# Patient Record
Sex: Female | Born: 1980 | Race: Black or African American | Hispanic: No | Marital: Single | Smoking: Never smoker
Health system: Southern US, Community
[De-identification: ages and names within clinical notes are randomized; demographics above are authoritative.]

## PROBLEM LIST (undated history)

## (undated) ENCOUNTER — Emergency Department (HOSPITAL_COMMUNITY): Payer: Medicaid Other

## (undated) DIAGNOSIS — F419 Anxiety disorder, unspecified: Secondary | ICD-10-CM

## (undated) DIAGNOSIS — J45909 Unspecified asthma, uncomplicated: Secondary | ICD-10-CM

## (undated) DIAGNOSIS — K649 Unspecified hemorrhoids: Secondary | ICD-10-CM

## (undated) HISTORY — PX: TUBAL LIGATION: SHX77

---

## 2004-12-23 HISTORY — PX: TUBAL LIGATION: SHX77

## 2009-05-05 ENCOUNTER — Emergency Department (HOSPITAL_COMMUNITY): Admission: EM | Admit: 2009-05-05 | Discharge: 2009-05-05 | Payer: Self-pay | Admitting: Emergency Medicine

## 2009-05-16 ENCOUNTER — Ambulatory Visit: Payer: Self-pay | Admitting: Gastroenterology

## 2009-06-01 ENCOUNTER — Ambulatory Visit: Payer: Self-pay | Admitting: Gastroenterology

## 2011-04-02 LAB — D-DIMER, QUANTITATIVE: D-Dimer, Quant: 0.49 ug/mL-FEU — ABNORMAL HIGH (ref 0.00–0.48)

## 2011-04-02 LAB — POCT CARDIAC MARKERS
CKMB, poc: <1 ng/mL — ABNORMAL LOW (ref 1.0–8.0)
Myoglobin, poc: 49.2 ng/mL (ref 12–200)
Troponin i, poc: <0.05 ng/mL (ref 0.00–0.09)

## 2011-04-02 LAB — POCT I-STAT, CHEM 8
BUN: 5 mg/dL — ABNORMAL LOW (ref 6–23)
Chloride: 104 mEq/L (ref 96–112)
HCT: 41 % (ref 36.0–46.0)
Potassium: 3.2 mEq/L — ABNORMAL LOW (ref 3.5–5.1)
Sodium: 141 mEq/L (ref 135–145)

## 2011-12-20 ENCOUNTER — Emergency Department (HOSPITAL_COMMUNITY)
Admission: EM | Admit: 2011-12-20 | Discharge: 2011-12-20 | Discharge status: Against Medical Advice | Payer: Self-pay | Attending: Emergency Medicine | Admitting: Emergency Medicine

## 2011-12-20 ENCOUNTER — Encounter: Payer: Self-pay | Admitting: Emergency Medicine

## 2011-12-20 DIAGNOSIS — R52 Pain, unspecified: Secondary | ICD-10-CM | POA: Insufficient documentation

## 2011-12-20 DIAGNOSIS — R51 Headache: Secondary | ICD-10-CM | POA: Insufficient documentation

## 2011-12-20 DIAGNOSIS — R059 Cough, unspecified: Secondary | ICD-10-CM | POA: Insufficient documentation

## 2011-12-20 DIAGNOSIS — R05 Cough: Secondary | ICD-10-CM | POA: Insufficient documentation

## 2011-12-20 NOTE — ED Notes (Signed)
Pt alert, nad, c/o cough, headaches, body aches, onset a few days ago recent ill contact with mother, resp even unlabored, skin bwd, moist npc noted

## 2011-12-22 ENCOUNTER — Emergency Department (HOSPITAL_COMMUNITY)
Admission: EM | Admit: 2011-12-22 | Discharge: 2011-12-22 | Disposition: A | Discharge status: Home / Self Care | Payer: Medicaid Other | Attending: Emergency Medicine | Admitting: Emergency Medicine

## 2011-12-22 ENCOUNTER — Encounter: Payer: Self-pay | Admitting: *Deleted

## 2011-12-22 ENCOUNTER — Emergency Department (HOSPITAL_COMMUNITY): Payer: Medicaid Other

## 2011-12-22 DIAGNOSIS — J189 Pneumonia, unspecified organism: Secondary | ICD-10-CM | POA: Insufficient documentation

## 2011-12-22 DIAGNOSIS — R197 Diarrhea, unspecified: Secondary | ICD-10-CM | POA: Insufficient documentation

## 2011-12-22 DIAGNOSIS — IMO0001 Reserved for inherently not codable concepts without codable children: Secondary | ICD-10-CM | POA: Insufficient documentation

## 2011-12-22 DIAGNOSIS — R509 Fever, unspecified: Secondary | ICD-10-CM | POA: Insufficient documentation

## 2011-12-22 DIAGNOSIS — R1110 Vomiting, unspecified: Secondary | ICD-10-CM | POA: Insufficient documentation

## 2011-12-22 DIAGNOSIS — R05 Cough: Secondary | ICD-10-CM | POA: Insufficient documentation

## 2011-12-22 DIAGNOSIS — R059 Cough, unspecified: Secondary | ICD-10-CM | POA: Insufficient documentation

## 2011-12-22 DIAGNOSIS — R079 Chest pain, unspecified: Secondary | ICD-10-CM | POA: Insufficient documentation

## 2011-12-22 DIAGNOSIS — R51 Headache: Secondary | ICD-10-CM | POA: Insufficient documentation

## 2011-12-22 LAB — URINALYSIS, ROUTINE W REFLEX MICROSCOPIC
Bilirubin Urine: NEGATIVE
Ketones, ur: 15 mg/dL — AB
Nitrite: NEGATIVE
Protein, ur: 30 mg/dL — AB
Urobilinogen, UA: 1 mg/dL (ref 0.0–1.0)
pH: 5.5 (ref 5.0–8.0)

## 2011-12-22 LAB — URINE MICROSCOPIC-ADD ON

## 2011-12-22 MED ORDER — OXYCODONE-ACETAMINOPHEN 5-325 MG PO TABS: 1.0000 | ORAL_TABLET | ORAL | Status: AC | PRN

## 2011-12-22 MED ORDER — SODIUM CHLORIDE 0.9 % IV BOLUS (SEPSIS)
1000.0000 mL | Freq: Once | INTRAVENOUS | Status: AC
  Administered 2011-12-22: 1000 mL via INTRAVENOUS

## 2011-12-22 MED ORDER — KETOROLAC TROMETHAMINE 30 MG/ML IJ SOLN
30.0000 mg | Freq: Once | INTRAMUSCULAR | Status: AC
  Administered 2011-12-22: 30 mg via INTRAVENOUS
  Filled 2011-12-22: qty 1

## 2011-12-22 MED ORDER — DOXYCYCLINE HYCLATE 100 MG PO CAPS: 100.0000 mg | ORAL_CAPSULE | Freq: Two times a day (BID) | ORAL | Status: AC

## 2011-12-22 MED ORDER — DEXTROSE 5 % IV SOLN
1.0000 g | Freq: Once | INTRAVENOUS | Status: AC
  Administered 2011-12-22: 1 g via INTRAVENOUS
  Filled 2011-12-22: qty 10

## 2011-12-22 MED ORDER — ONDANSETRON HCL 4 MG/2ML IJ SOLN
INTRAMUSCULAR | Status: AC
  Administered 2011-12-22: 4 mg via INTRAVENOUS
  Filled 2011-12-22: qty 2

## 2011-12-22 MED ORDER — ACETAMINOPHEN 325 MG PO TABS
975.0000 mg | ORAL_TABLET | Freq: Once | ORAL | Status: AC
  Administered 2011-12-22: 975 mg via ORAL
  Filled 2011-12-22: qty 3

## 2011-12-22 NOTE — ED Notes (Signed)
The pt has had vomiting with  abd and chest pain when she vomits since 12-25

## 2011-12-22 NOTE — ED Provider Notes (Signed)
History     CSN: [id number]  Arrival date & time 12/22/11  0300   First MD Initiated Contact with Patient 12/22/11 0400      Chief Complaint  Patient presents with  . Emesis     Patient is a 30 y.o. female presenting with flu symptoms. The history is provided by the patient.  Influenza This is a new problem. The current episode started more than 2 days ago. The problem occurs hourly. The problem has been gradually worsening. Associated symptoms include chest pain and headaches. The symptoms are aggravated by nothing. The symptoms are relieved by nothing.  pt reports cough for at least 4 days - reports fever, myalgias, and CP with coughing She also reports vomiting/diarrhea that started 2 days ago (stool/vomitus are nonbloody) She denies dysuria Denies vag bleeding/discharge Denies significant abd pain Denies back pain She has a mild headache  PMH - none  History reviewed. No pertinent past surgical history.  History reviewed. No pertinent family history.  History  Substance Use Topics  . Smoking status: Never Smoker   . Smokeless tobacco: Not on file  . Alcohol Use: Yes    OB History    Grav Para Term Preterm Abortions TAB SAB Ect Mult Living                  Review of Systems  Cardiovascular: Positive for chest pain.  Neurological: Positive for headaches.  All other systems reviewed and are negative.    Allergies  Review of patient's allergies indicates no known allergies.  Home Medications   Current Outpatient Rx  Name Route Sig Dispense Refill  . ACETAMINOPHEN 325 MG PO TABS Oral Take 650 mg by mouth every 6 (six) hours as needed. Headache pain or fever      . GUAIFENESIN ER 600 MG PO TB12 Oral Take 1,200 mg by mouth 2 (two) times daily. Congestion      . DAYQUIL PO Oral Take 1-2 tablets by mouth every 6 (six) hours as needed. Cold symptoms Hold while in hospital     . ALBUTEROL SULFATE HFA 108 (90 BASE) MCG/ACT IN AERS Inhalation Inhale 2 puffs  into the lungs every 6 (six) hours as needed. wheezing       BP 106/76  Pulse 122  Temp 102.7 F (39.3 C)  Resp 20  SpO2 95%  LMP 12/03/2011  Physical Exam  CONSTITUTIONAL: Well developed/well nourished HEAD AND FACE: Normocephalic/atraumatic EYES: EOMI/PERRL ENMT: Mucous membranes moist, uvula midline NECK: supple no meningeal signs SPINE:entire spine nontender CV: S1/S2 noted, no murmurs/rubs/gallops noted LUNGS: rales right base.  No tachypnea noted.  Able to speak to me comfortably ABDOMEN: soft, nontender, no rebound or guarding GU:no cva tenderness NEURO: Pt is awake/alert, moves all extremitiesx4 EXTREMITIES: pulses normal, full ROM SKIN: warm, color normal PSYCH: no abnormalities of mood noted   ED Course  Procedures   Labs Reviewed  URINALYSIS, ROUTINE W REFLEX MICROSCOPIC - Abnormal; Notable for the following:    APPearance CLOUDY (*)    Specific Gravity, Urine 1.036 (*)    Ketones, ur 15 (*)    Protein, ur 30 (*)    Leukocytes, UA MODERATE (*)    All other components within normal limits  URINE MICROSCOPIC-ADD ON - Abnormal; Notable for the following:    Squamous Epithelial / LPF MANY (*)    Bacteria, UA MANY (*)    Crystals CA OXALATE CRYSTALS (*)    All other components within normal limits  POCT PREGNANCY,  URINE  POCT PREGNANCY, URINE   4:16 AM Pt with cough for several days, abnormal lung sounds in right base, will get CXR Will rehydrate as well  6:42 AM Pt improved, she was given IV fluids and antibiotics for her pneumonia She is stable for d/c - she has been ambulatory without difficulty and her vitals are improved BP 120/98  Pulse 87  Temp(Src) 98.8 F (37.1 C) (Oral)  Resp 18  SpO2 98%  LMP 12/03/2011 We discussed strict return precautions  MDM  Nursing notes reviewed and considered in documentation All labs/vitals reviewed and considered xrays reviewed and considered         Joya Gaskins, MD 12/22/11 [phone]

## 2012-08-12 ENCOUNTER — Encounter (HOSPITAL_COMMUNITY): Payer: Self-pay | Admitting: *Deleted

## 2012-08-12 ENCOUNTER — Emergency Department (HOSPITAL_COMMUNITY): Payer: Medicaid Other

## 2012-08-12 ENCOUNTER — Emergency Department (HOSPITAL_COMMUNITY)
Admission: EM | Admit: 2012-08-12 | Discharge: 2012-08-12 | Disposition: A | Discharge status: Home / Self Care | Payer: Medicaid Other | Attending: Emergency Medicine | Admitting: Emergency Medicine

## 2012-08-12 DIAGNOSIS — N390 Urinary tract infection, site not specified: Secondary | ICD-10-CM

## 2012-08-12 DIAGNOSIS — R1031 Right lower quadrant pain: Secondary | ICD-10-CM | POA: Insufficient documentation

## 2012-08-12 DIAGNOSIS — R10819 Abdominal tenderness, unspecified site: Secondary | ICD-10-CM | POA: Insufficient documentation

## 2012-08-12 LAB — BASIC METABOLIC PANEL
Calcium: 9.3 mg/dL (ref 8.4–10.5)
Chloride: 101 mEq/L (ref 96–112)
Creatinine, Ser: 0.71 mg/dL (ref 0.50–1.10)
GFR calc Af Amer: >90 mL/min (ref >90)
Sodium: 135 mEq/L (ref 135–145)

## 2012-08-12 LAB — CBC
MCH: 26.2 pg (ref 26.0–34.0)
MCV: 80.7 fL (ref 78.0–100.0)
Platelets: 342 10*3/uL (ref 150–400)
RDW: 15.5 % (ref 11.5–15.5)
WBC: 12.8 10*3/uL — ABNORMAL HIGH (ref 4.0–10.5)

## 2012-08-12 LAB — URINALYSIS, ROUTINE W REFLEX MICROSCOPIC
Bilirubin Urine: NEGATIVE
Nitrite: NEGATIVE
Specific Gravity, Urine: 1.029 (ref 1.005–1.030)
Urobilinogen, UA: 0.2 mg/dL (ref 0.0–1.0)

## 2012-08-12 LAB — LIPASE, BLOOD: Lipase: 84 U/L — ABNORMAL HIGH (ref 11–59)

## 2012-08-12 MED ORDER — OXYCODONE-ACETAMINOPHEN 5-325 MG PO TABS: 2.0000 | ORAL_TABLET | ORAL | Status: AC | PRN

## 2012-08-12 MED ORDER — IOHEXOL 300 MG/ML  SOLN
100.0000 mL | Freq: Once | INTRAMUSCULAR | Status: AC | PRN
  Administered 2012-08-12: 100 mL via INTRAVENOUS

## 2012-08-12 MED ORDER — ONDANSETRON HCL 4 MG/2ML IJ SOLN
4.0000 mg | Freq: Once | INTRAMUSCULAR | Status: AC
  Administered 2012-08-12: 4 mg via INTRAVENOUS
  Filled 2012-08-12: qty 2

## 2012-08-12 MED ORDER — CIPROFLOXACIN HCL 500 MG PO TABS: 500.0000 mg | ORAL_TABLET | Freq: Two times a day (BID) | ORAL | Status: AC

## 2012-08-12 MED ORDER — SODIUM CHLORIDE 0.9 % IV SOLN
Freq: Once | INTRAVENOUS | Status: AC
  Administered 2012-08-12: 02:00:00 via INTRAVENOUS

## 2012-08-12 MED ORDER — HYDROMORPHONE HCL PF 1 MG/ML IJ SOLN
0.5000 mg | Freq: Once | INTRAMUSCULAR | Status: AC
  Administered 2012-08-12: 0.5 mg via INTRAVENOUS
  Filled 2012-08-12: qty 1

## 2012-08-12 NOTE — ED Provider Notes (Signed)
History     CSN: [id number]  Arrival date & time 08/12/12  0120   First MD Initiated Contact with Patient 08/12/12 [phone]      Chief Complaint  Patient presents with  . Abdominal Pain    (Consider location/radiation/quality/duration/timing/severity/associated sxs/prior treatment) Patient is a 31 y.o. female presenting with abdominal pain. The history is provided by the patient.  Abdominal Pain The primary symptoms of the illness include abdominal pain.   patient here with lower abdominal pain bilateral lower quadrants. Pain is been there for 2 days and worse with lying on her right side. Denies any nausea vomiting or diarrhea. No fever or vaginal bleeding or discharge. No medication taken prior to arrival. No prior history of same. Denies any cough or dyspnea.  History reviewed. No pertinent past medical history.  Past Surgical History  Procedure Date  . Tubal ligation     No family history on file.  History  Substance Use Topics  . Smoking status: Never Smoker   . Smokeless tobacco: Not on file  . Alcohol Use: Yes    OB History    Grav Para Term Preterm Abortions TAB SAB Ect Mult Living                  Review of Systems  Gastrointestinal: Positive for abdominal pain.  All other systems reviewed and are negative.    Allergies  Review of patient's allergies indicates no known allergies.  Home Medications   Current Outpatient Rx  Name Route Sig Dispense Refill  . ACETAMINOPHEN 325 MG PO TABS Oral Take 650 mg by mouth every 6 (six) hours as needed. Headache pain or fever      . ALBUTEROL SULFATE HFA 108 (90 BASE) MCG/ACT IN AERS Inhalation Inhale 2 puffs into the lungs every 6 (six) hours as needed. wheezing     . GUAIFENESIN ER 600 MG PO TB12 Oral Take 1,200 mg by mouth 2 (two) times daily. Congestion      . DAYQUIL PO Oral Take 1-2 tablets by mouth every 6 (six) hours as needed. Cold symptoms Hold while in hospital       BP 108/72  Pulse 86  Temp 97.8  F (36.6 C) (Oral)  Resp 22  Ht 5\' 6"  (1.676 m)  Wt 132 lb (59.875 kg)  BMI 21.31 kg/m2  SpO2 100%  LMP 06/22/2012  Physical Exam  Nursing note and vitals reviewed. Constitutional: She is oriented to person, place, and time. She appears well-developed and well-nourished.  Non-toxic appearance. No distress.  HENT:  Head: Normocephalic and atraumatic.  Eyes: Conjunctivae, EOM and lids are normal. Pupils are equal, round, and reactive to light.  Neck: Normal range of motion. Neck supple. No tracheal deviation present. No mass present.  Cardiovascular: Normal rate, regular rhythm and normal heart sounds.  Exam reveals no gallop.   No murmur heard. Pulmonary/Chest: Effort normal and breath sounds normal. No stridor. No respiratory distress. She has no decreased breath sounds. She has no wheezes. She has no rhonchi. She has no rales.  Abdominal: Soft. Normal appearance and bowel sounds are normal. She exhibits no distension. There is tenderness in the right lower quadrant and left lower quadrant. There is no rigidity, no rebound, no guarding and no CVA tenderness.  Musculoskeletal: Normal range of motion. She exhibits no edema and no tenderness.  Neurological: She is alert and oriented to person, place, and time. She has normal strength. No cranial nerve deficit or sensory deficit. GCS eye subscore  is 4. GCS verbal subscore is 5. GCS motor subscore is 6.  Skin: Skin is warm and dry. No abrasion and no rash noted.  Psychiatric: She has a normal mood and affect. Her speech is normal and behavior is normal.    ED Course  Procedures (including critical care time)  Labs Reviewed  URINALYSIS, ROUTINE W REFLEX MICROSCOPIC - Abnormal; Notable for the following:    APPearance CLOUDY (*)     Hgb urine dipstick TRACE (*)     Leukocytes, UA LARGE (*)     All other components within normal limits  URINE MICROSCOPIC-ADD ON - Abnormal; Notable for the following:    Squamous Epithelial / LPF MANY (*)      Bacteria, UA MANY (*)     All other components within normal limits  PREGNANCY, URINE  CBC  BASIC METABOLIC PANEL  LIPASE, BLOOD   No results found.   No diagnosis found.    MDM  Pt to be tx for her uti        Toy Baker, MD 08/12/12 0530

## 2012-08-12 NOTE — ED Notes (Signed)
Pt c/o abd pain x 2 days; increased right side; no n/v; lmp 06/22/12

## 2012-10-30 ENCOUNTER — Emergency Department (HOSPITAL_COMMUNITY)
Admission: EM | Admit: 2012-10-30 | Discharge: 2012-10-30 | Disposition: A | Discharge status: Home / Self Care | Payer: Self-pay | Attending: Emergency Medicine | Admitting: Emergency Medicine

## 2012-10-30 ENCOUNTER — Emergency Department (HOSPITAL_COMMUNITY): Payer: Self-pay

## 2012-10-30 ENCOUNTER — Encounter (HOSPITAL_COMMUNITY): Payer: Self-pay | Admitting: *Deleted

## 2012-10-30 DIAGNOSIS — Z79899 Other long term (current) drug therapy: Secondary | ICD-10-CM | POA: Insufficient documentation

## 2012-10-30 DIAGNOSIS — R002 Palpitations: Secondary | ICD-10-CM | POA: Insufficient documentation

## 2012-10-30 DIAGNOSIS — R059 Cough, unspecified: Secondary | ICD-10-CM | POA: Insufficient documentation

## 2012-10-30 DIAGNOSIS — R079 Chest pain, unspecified: Secondary | ICD-10-CM | POA: Insufficient documentation

## 2012-10-30 DIAGNOSIS — F419 Anxiety disorder, unspecified: Secondary | ICD-10-CM

## 2012-10-30 DIAGNOSIS — F411 Generalized anxiety disorder: Secondary | ICD-10-CM | POA: Insufficient documentation

## 2012-10-30 DIAGNOSIS — J45909 Unspecified asthma, uncomplicated: Secondary | ICD-10-CM | POA: Insufficient documentation

## 2012-10-30 DIAGNOSIS — R05 Cough: Secondary | ICD-10-CM | POA: Insufficient documentation

## 2012-10-30 HISTORY — DX: Anxiety disorder, unspecified: F41.9

## 2012-10-30 HISTORY — DX: Unspecified asthma, uncomplicated: J45.909

## 2012-10-30 MED ORDER — LORAZEPAM 1 MG PO TABS
1.0000 mg | ORAL_TABLET | Freq: Once | ORAL | Status: AC
  Administered 2012-10-30: 1 mg via ORAL
  Filled 2012-10-30: qty 1

## 2012-10-30 NOTE — ED Provider Notes (Signed)
History     CSN: [id number]  Arrival date & time 10/30/12  1042   First MD Initiated Contact with Patient 10/30/12 1146      Chief Complaint  Patient presents with  . Chest Pain    (Consider location/radiation/quality/duration/timing/severity/associated sxs/prior treatment) HPI Comments: Patient presents today with a chief complaint of chest pain.  Pain started this morning while at work.  She reports that she has had similar pain in the past that has come on at the onset of stress.  She has been diagnosed with Anxiety in the past.  She reports that the chest pain is associated with palpitations.  No shortness of breath.  No nausea, vomiting, or diaphoresis.  She reports that she was at work and feeling stressed at the onset of the pain.  Pain is not worse with deep breaths.  She is not on any estrogen containing medications.  No LE edema, erythema, or pain.  No prolonged travel or surgery in the past 4 weeks.  No history of HTN, hyperlipidemia, or DM.  No FH of cardiac disease.  She does not smoke.    The history is provided by the patient.    Past Medical History  Diagnosis Date  . Anxiety   . Asthma     Past Surgical History  Procedure Date  . Tubal ligation     No family history on file.  History  Substance Use Topics  . Smoking status: Never Smoker   . Smokeless tobacco: Not on file  . Alcohol Use: No    OB History    Grav Para Term Preterm Abortions TAB SAB Ect Mult Living                  Review of Systems  Constitutional: Negative for fever and chills.  Respiratory: Positive for cough. Negative for shortness of breath and wheezing.   Cardiovascular: Positive for chest pain and palpitations. Negative for leg swelling.  Gastrointestinal: Negative for nausea and vomiting.  Skin: Negative for rash.  Neurological: Negative for dizziness, syncope and light-headedness.    Allergies  Review of patient's allergies indicates no known allergies.  Home  Medications   Current Outpatient Rx  Name  Route  Sig  Dispense  Refill  . ALBUTEROL SULFATE HFA 108 (90 BASE) MCG/ACT IN AERS   Inhalation   Inhale 2 puffs into the lungs every 6 (six) hours as needed. wheezing            BP 105/63  Pulse 100  Temp 98.9 F (37.2 C) (Oral)  Resp 23  SpO2 100%  LMP 09/24/2012  Physical Exam  Nursing note and vitals reviewed. Constitutional: She appears well-developed and well-nourished. No distress.  HENT:  Head: Normocephalic and atraumatic.  Mouth/Throat: Oropharynx is clear and moist.  Neck: Normal range of motion. Neck supple.  Cardiovascular: Normal rate, regular rhythm, normal heart sounds and intact distal pulses.   Pulmonary/Chest: Effort normal and breath sounds normal. No respiratory distress. She has no wheezes. She has no rales.  Abdominal: Soft. There is no tenderness.  Musculoskeletal: Normal range of motion. She exhibits no edema.       No LE edema or erythema  Neurological: She is alert.  Skin: Skin is warm and dry. No rash noted. She is not diaphoretic.  Psychiatric: She has a normal mood and affect.    ED Course  Procedures (including critical care time)  Labs Reviewed - No data to display Dg Chest 2 View  10/30/2012  *RADIOLOGY REPORT*  Clinical Data: Left chest pain  CHEST - 2 VIEW  Comparison: 12/22/2011  Findings: Normal heart size, mediastinal contours, and pulmonary vascularity. Mild peribronchial thickening. No pulmonary infiltrate, pleural effusion, or pneumothorax. Bones unremarkable.  IMPRESSION: Minimal bronchitic changes without infiltrate.   Original Report Authenticated By: Ulyses Southward, M.D.      No diagnosis found.  1:50 PM Patient reports that her her symptoms have resolved at this time after given Ativan.   Date: 10/30/2012  Rate: 103  Rhythm: sinus tachycardia  QRS Axis: normal  Intervals: normal  ST/T Wave abnormalities: normal  Conduction Disutrbances:none  Narrative Interpretation:   Old  EKG Reviewed: none available    MDM  Patient presenting today with a chief complaint of chest pain.  Patient reports that she has had similar pain in the past associated with anxiety.  Patient given Ativan in the ED and the pain resolved.  No ischemic changes on EKG.  Negative CXR.  No cardiac risk factors.  Patient discharged home.  Return precautions discussed.          Pascal Lux [place], PA-C 10/31/12 [phone]

## 2012-10-30 NOTE — ED Notes (Addendum)
Pt reports she was at work this am when she started to have palpitations and cp.  Pt however denies SOB or lightheadedness at this time.  Pt states that this happened in the past and was dx with anxiety.  Pt reports having "a lot of stress" Lately.  Pt is calm and cooperative at this time.

## 2012-11-03 NOTE — ED Provider Notes (Signed)
Medical screening examination/treatment/procedure(s) were performed by non-physician practitioner and as supervising physician I was immediately available for consultation/collaboration.  Flint Melter, MD 11/03/12 [phone]

## 2012-12-27 ENCOUNTER — Encounter (HOSPITAL_COMMUNITY): Payer: Self-pay | Admitting: *Deleted

## 2012-12-27 ENCOUNTER — Emergency Department (HOSPITAL_COMMUNITY)
Admission: EM | Admit: 2012-12-27 | Discharge: 2012-12-27 | Disposition: A | Discharge status: Home / Self Care | Payer: Self-pay | Attending: Emergency Medicine | Admitting: Emergency Medicine

## 2012-12-27 DIAGNOSIS — R52 Pain, unspecified: Secondary | ICD-10-CM | POA: Insufficient documentation

## 2012-12-27 DIAGNOSIS — Z8659 Personal history of other mental and behavioral disorders: Secondary | ICD-10-CM | POA: Insufficient documentation

## 2012-12-27 DIAGNOSIS — IMO0002 Reserved for concepts with insufficient information to code with codable children: Secondary | ICD-10-CM | POA: Insufficient documentation

## 2012-12-27 DIAGNOSIS — R059 Cough, unspecified: Secondary | ICD-10-CM | POA: Insufficient documentation

## 2012-12-27 DIAGNOSIS — J45909 Unspecified asthma, uncomplicated: Secondary | ICD-10-CM | POA: Insufficient documentation

## 2012-12-27 DIAGNOSIS — R05 Cough: Secondary | ICD-10-CM | POA: Insufficient documentation

## 2012-12-27 DIAGNOSIS — R51 Headache: Secondary | ICD-10-CM | POA: Insufficient documentation

## 2012-12-27 DIAGNOSIS — J111 Influenza due to unidentified influenza virus with other respiratory manifestations: Secondary | ICD-10-CM | POA: Insufficient documentation

## 2012-12-27 DIAGNOSIS — R509 Fever, unspecified: Secondary | ICD-10-CM | POA: Insufficient documentation

## 2012-12-27 LAB — URINE MICROSCOPIC-ADD ON

## 2012-12-27 LAB — URINALYSIS, ROUTINE W REFLEX MICROSCOPIC
Nitrite: NEGATIVE
Specific Gravity, Urine: 1.034 — ABNORMAL HIGH (ref 1.005–1.030)
pH: 6.5 (ref 5.0–8.0)

## 2012-12-27 LAB — RAPID STREP SCREEN (MED CTR MEBANE ONLY): Streptococcus, Group A Screen (Direct): NEGATIVE

## 2012-12-27 MED ORDER — OSELTAMIVIR PHOSPHATE 75 MG PO CAPS: 75.0000 mg | ORAL_CAPSULE | Freq: Two times a day (BID) | ORAL | Status: DC

## 2012-12-27 MED ORDER — SODIUM CHLORIDE 0.9 % IV BOLUS (SEPSIS)
1000.0000 mL | Freq: Once | INTRAVENOUS | Status: AC
  Administered 2012-12-27: 1000 mL via INTRAVENOUS

## 2012-12-27 MED ORDER — OSELTAMIVIR PHOSPHATE 75 MG PO CAPS
75.0000 mg | ORAL_CAPSULE | Freq: Once | ORAL | Status: AC
  Administered 2012-12-27: 75 mg via ORAL
  Filled 2012-12-27: qty 1

## 2012-12-27 MED ORDER — ACETAMINOPHEN 325 MG PO TABS
650.0000 mg | ORAL_TABLET | Freq: Once | ORAL | Status: AC
  Administered 2012-12-27: 650 mg via ORAL
  Filled 2012-12-27: qty 2

## 2012-12-27 MED ORDER — KETOROLAC TROMETHAMINE 30 MG/ML IJ SOLN
30.0000 mg | Freq: Once | INTRAMUSCULAR | Status: AC
  Administered 2012-12-27: 30 mg via INTRAMUSCULAR
  Filled 2012-12-27: qty 1

## 2012-12-27 MED ORDER — IBUPROFEN 600 MG PO TABS: 600.0000 mg | ORAL_TABLET | Freq: Four times a day (QID) | ORAL | Status: DC | PRN

## 2012-12-27 NOTE — ED Provider Notes (Addendum)
History     CSN: [id number]  Arrival date & time 12/27/12  0139   First MD Initiated Contact with Patient 12/27/12 0150      Chief Complaint  Patient presents with  . multiple complaints     (Consider location/radiation/quality/duration/timing/severity/associated sxs/prior treatment) HPI Comments: Yesterday developed sore throat today body aches, headache Has not taken any medication nor eaten today due to "it hurts too much to swallow" but is handel ing own secretions   The history is provided by the patient.    Past Medical History  Diagnosis Date  . Anxiety   . Asthma     Past Surgical History  Procedure Date  . Tubal ligation     No family history on file.  History  Substance Use Topics  . Smoking status: Never Smoker   . Smokeless tobacco: Not on file  . Alcohol Use: No    OB History    Grav Para Term Preterm Abortions TAB SAB Ect Mult Living                  Review of Systems  Constitutional: Positive for fever and chills.  HENT: Positive for sore throat. Negative for ear pain, congestion, rhinorrhea and neck pain.   Respiratory: Positive for cough. Negative for shortness of breath.   Gastrointestinal: Negative for nausea, abdominal pain and diarrhea.  Genitourinary: Negative for dysuria.  Musculoskeletal: Negative for myalgias.  Neurological: Positive for headaches. Negative for dizziness.    Allergies  Review of patient's allergies indicates no known allergies.  Home Medications   Current Outpatient Rx  Name  Route  Sig  Dispense  Refill  . ALBUTEROL SULFATE HFA 108 (90 BASE) MCG/ACT IN AERS   Inhalation   Inhale 2 puffs into the lungs every 6 (six) hours as needed. wheezing          . IBUPROFEN 600 MG PO TABS   Oral   Take 1 tablet (600 mg total) by mouth every 6 (six) hours as needed for pain.   30 tablet   0   . OSELTAMIVIR PHOSPHATE 75 MG PO CAPS   Oral   Take 1 capsule (75 mg total) by mouth 2 (two) times daily.   9 capsule  0     BP 99/61  Pulse 107  Temp 101.3 F (38.5 C) (Oral)  Resp 14  SpO2 97%  Physical Exam  Constitutional: She appears well-developed and well-nourished. No distress.  HENT:  Head: Normocephalic.  Neck: Normal range of motion.  Cardiovascular: Tachycardia present.   Pulmonary/Chest: Effort normal. She has wheezes.  Abdominal: Soft. Bowel sounds are normal.  Musculoskeletal: Normal range of motion.  Neurological: She is alert.  Skin: Skin is warm. Rash noted.    ED Course  Procedures (including critical care time)  Labs Reviewed  URINALYSIS, ROUTINE W REFLEX MICROSCOPIC - Abnormal; Notable for the following:    APPearance CLOUDY (*)     Specific Gravity, Urine 1.034 (*)     Ketones, ur 15 (*)     Leukocytes, UA MODERATE (*)     All other components within normal limits  URINE MICROSCOPIC-ADD ON - Abnormal; Notable for the following:    Squamous Epithelial / LPF MANY (*)     Bacteria, UA FEW (*)     All other components within normal limits  RAPID STREP SCREEN  URINE CULTURE  LAB REPORT - SCANNED   No results found.   1. Influenza  MDM  Fever decreased feels better tolerating PO         Arman Filter, NP 12/27/12 0353  Arman Filter, NP 01/08/13 2013

## 2012-12-27 NOTE — ED Notes (Signed)
Pt states understanding of discharge instructions 

## 2012-12-27 NOTE — ED Notes (Signed)
Cold cough headache aching all over since this am.  She has not taken any tylenol or advil because it was hard to swallow meds  Sore throat

## 2012-12-28 LAB — URINE CULTURE

## 2012-12-28 NOTE — ED Provider Notes (Signed)
Medical screening examination/treatment/procedure(s) were performed by non-physician practitioner and as supervising physician I was immediately available for consultation/collaboration.  Brynda Heick T Zyire Eidson, MD 12/28/12 2319 

## 2013-01-11 NOTE — ED Provider Notes (Signed)
Medical screening examination/treatment/procedure(s) were performed by non-physician practitioner and as supervising physician I was immediately available for consultation/collaboration.   Richardean Canal, MD 01/11/13 [phone]

## 2013-01-20 ENCOUNTER — Encounter (HOSPITAL_COMMUNITY): Payer: Self-pay

## 2013-01-20 ENCOUNTER — Emergency Department (HOSPITAL_COMMUNITY)
Admission: EM | Admit: 2013-01-20 | Discharge: 2013-01-20 | Disposition: A | Discharge status: Home / Self Care | Payer: Self-pay | Attending: Emergency Medicine | Admitting: Emergency Medicine

## 2013-01-20 DIAGNOSIS — J45909 Unspecified asthma, uncomplicated: Secondary | ICD-10-CM | POA: Insufficient documentation

## 2013-01-20 DIAGNOSIS — K644 Residual hemorrhoidal skin tags: Secondary | ICD-10-CM | POA: Insufficient documentation

## 2013-01-20 DIAGNOSIS — K625 Hemorrhage of anus and rectum: Secondary | ICD-10-CM | POA: Insufficient documentation

## 2013-01-20 DIAGNOSIS — Z79899 Other long term (current) drug therapy: Secondary | ICD-10-CM | POA: Insufficient documentation

## 2013-01-20 DIAGNOSIS — Z8659 Personal history of other mental and behavioral disorders: Secondary | ICD-10-CM | POA: Insufficient documentation

## 2013-01-20 HISTORY — DX: Unspecified hemorrhoids: K64.9

## 2013-01-20 LAB — COMPREHENSIVE METABOLIC PANEL
ALT: 14 U/L (ref 0–35)
AST: 19 U/L (ref 0–37)
Alkaline Phosphatase: 48 U/L (ref 39–117)
CO2: 25 mEq/L (ref 19–32)
GFR calc Af Amer: >90 mL/min (ref >90)
GFR calc non Af Amer: >90 mL/min (ref >90)
Glucose, Bld: 91 mg/dL (ref 70–99)
Potassium: 3.8 mEq/L (ref 3.5–5.1)
Sodium: 139 mEq/L (ref 135–145)
Total Protein: 8.4 g/dL — ABNORMAL HIGH (ref 6.0–8.3)

## 2013-01-20 LAB — CBC
Hemoglobin: 11.7 g/dL — ABNORMAL LOW (ref 12.0–15.0)
RBC: 4.41 MIL/uL (ref 3.87–5.11)

## 2013-01-20 MED ORDER — DOCUSATE SODIUM 100 MG PO CAPS: 100.0000 mg | ORAL_CAPSULE | Freq: Two times a day (BID) | ORAL | Status: DC

## 2013-01-20 MED ORDER — PRAMOXINE HCL 1 % RE FOAM: RECTAL | Status: DC | PRN

## 2013-01-20 NOTE — ED Provider Notes (Signed)
History     CSN: [id number] Arrival date & time 01/20/13  1252 First MD Initiated Contact with Patient 01/20/13 1338      Chief Complaint  Patient presents with  . Rectal Bleeding    HPI Pt noticed blood after having a bowel movement.  She noticed blood in the commode after her BM as well as after wiping.  No pain in the rectal area.  No abdominal pain.  She had another BM this morning and the same thing occurred.  No history of similar symptoms. Patient was concerns that she decided to come get it checked out. She denies any dizziness or weakness. She denies any rectal discharge. She denies any fevers or other systemic symptoms Past Medical History  Diagnosis Date  . Anxiety   . Asthma   . Hemorrhoids     Past Surgical History  Procedure Date  . Tubal ligation     No family history on file.  History  Substance Use Topics  . Smoking status: Never Smoker   . Smokeless tobacco: Not on file  . Alcohol Use: No    OB History    Grav Para Term Preterm Abortions TAB SAB Ect Mult Living                  Review of Systems  All other systems reviewed and are negative.    Allergies  Review of patient's allergies indicates no known allergies.  Home Medications   Current Outpatient Rx  Name  Route  Sig  Dispense  Refill  . ALBUTEROL SULFATE HFA 108 (90 BASE) MCG/ACT IN AERS   Inhalation   Inhale 2 puffs into the lungs every 6 (six) hours as needed. wheezing            BP 123/71  Pulse 99  Temp 98.6 F (37 C) (Oral)  SpO2 100%  LMP 01/17/2013  Physical Exam  Nursing note and vitals reviewed. Constitutional: She appears well-developed and well-nourished. No distress.  HENT:  Head: Normocephalic and atraumatic.  Right Ear: External ear normal.  Left Ear: External ear normal.  Eyes: Conjunctivae normal are normal. Right eye exhibits no discharge. Left eye exhibits no discharge. No scleral icterus.  Neck: Neck supple. No tracheal deviation present.    Cardiovascular: Normal rate, regular rhythm and intact distal pulses.   Pulmonary/Chest: Effort normal and breath sounds normal. No stridor. No respiratory distress. She has no wheezes. She has no rales.  Abdominal: Soft. Bowel sounds are normal. She exhibits no distension. There is no tenderness. There is no rebound and no guarding.  Genitourinary: Rectal exam shows external hemorrhoid. Rectal exam shows no mass and no tenderness.       External hemorrhoid noted, no active bleeding, internal rectal exam without mass or tenderness no bright red blood noted  Musculoskeletal: She exhibits no edema and no tenderness.  Neurological: She is alert. She has normal strength. No sensory deficit. Cranial nerve deficit:  no gross defecits noted. She exhibits normal muscle tone. She displays no seizure activity. Coordination normal.  Skin: Skin is warm and dry. No rash noted.  Psychiatric: She has a normal mood and affect.    ED Course  Procedures (including critical care time)  Labs Reviewed  CBC - Abnormal; Notable for the following:    Hemoglobin 11.7 (*)     All other components within normal limits  COMPREHENSIVE METABOLIC PANEL - Abnormal; Notable for the following:    Total Protein 8.4 (*)  Total Bilirubin 0.2 (*)     All other components within normal limits  OCCULT BLOOD, POC DEVICE   No results found.   1. Rectal bleeding   2. External hemorrhoid       MDM  The patient most likely is having hemorrhoidal bleeding although I did not see active bleeding in the emergency department.I do recommend that she follow up with the gastroenterologist. [facility] The patient was given instructions that should prompt her to return to the emergency department.        Celene Kras, MD 01/20/13 1440

## 2013-01-20 NOTE — ED Notes (Signed)
Pt states she noticed yesterday that after a BM the commode was covered with bright red blood. Has hemorrhoids from child birth and thought it was that until she saw the commode. Has pain with the hemorrhoids.

## 2013-01-20 NOTE — ED Notes (Signed)
Pt c/o rectal bleeding that started yesterday after using the restroom. Pt reports she saw bright red blood in stool and with wiping.has hx of hemorrhoids but never had this much bld in stool before. Pt reports she continues to bleed for a few seconds after having a BM but no blood just dripping out of anus. Pt denies abd pain. Pt in nad.

## 2013-05-18 ENCOUNTER — Encounter (HOSPITAL_COMMUNITY): Payer: Self-pay

## 2013-05-18 ENCOUNTER — Emergency Department (HOSPITAL_COMMUNITY)
Admission: EM | Admit: 2013-05-18 | Discharge: 2013-05-18 | Disposition: A | Discharge status: Home / Self Care | Payer: Self-pay | Attending: Emergency Medicine | Admitting: Emergency Medicine

## 2013-05-18 ENCOUNTER — Emergency Department (HOSPITAL_COMMUNITY)
Admission: EM | Admit: 2013-05-18 | Discharge: 2013-05-18 | Discharge status: Against Medical Advice | Payer: Self-pay | Attending: Emergency Medicine | Admitting: Emergency Medicine

## 2013-05-18 DIAGNOSIS — N10 Acute tubulo-interstitial nephritis: Secondary | ICD-10-CM | POA: Insufficient documentation

## 2013-05-18 DIAGNOSIS — J45909 Unspecified asthma, uncomplicated: Secondary | ICD-10-CM | POA: Insufficient documentation

## 2013-05-18 DIAGNOSIS — Z8679 Personal history of other diseases of the circulatory system: Secondary | ICD-10-CM | POA: Insufficient documentation

## 2013-05-18 DIAGNOSIS — R52 Pain, unspecified: Secondary | ICD-10-CM | POA: Insufficient documentation

## 2013-05-18 DIAGNOSIS — Z8659 Personal history of other mental and behavioral disorders: Secondary | ICD-10-CM | POA: Insufficient documentation

## 2013-05-18 DIAGNOSIS — R112 Nausea with vomiting, unspecified: Secondary | ICD-10-CM | POA: Insufficient documentation

## 2013-05-18 DIAGNOSIS — Z79899 Other long term (current) drug therapy: Secondary | ICD-10-CM | POA: Insufficient documentation

## 2013-05-18 DIAGNOSIS — R509 Fever, unspecified: Secondary | ICD-10-CM | POA: Insufficient documentation

## 2013-05-18 DIAGNOSIS — R51 Headache: Secondary | ICD-10-CM | POA: Insufficient documentation

## 2013-05-18 DIAGNOSIS — N390 Urinary tract infection, site not specified: Secondary | ICD-10-CM | POA: Insufficient documentation

## 2013-05-18 DIAGNOSIS — R5381 Other malaise: Secondary | ICD-10-CM | POA: Insufficient documentation

## 2013-05-18 DIAGNOSIS — R350 Frequency of micturition: Secondary | ICD-10-CM | POA: Insufficient documentation

## 2013-05-18 DIAGNOSIS — N12 Tubulo-interstitial nephritis, not specified as acute or chronic: Secondary | ICD-10-CM | POA: Diagnosis present

## 2013-05-18 DIAGNOSIS — R300 Dysuria: Secondary | ICD-10-CM | POA: Insufficient documentation

## 2013-05-18 DIAGNOSIS — Z9851 Tubal ligation status: Secondary | ICD-10-CM | POA: Insufficient documentation

## 2013-05-18 DIAGNOSIS — Z3202 Encounter for pregnancy test, result negative: Secondary | ICD-10-CM | POA: Insufficient documentation

## 2013-05-18 LAB — URINALYSIS, ROUTINE W REFLEX MICROSCOPIC
Bilirubin Urine: NEGATIVE
Glucose, UA: NEGATIVE mg/dL
Specific Gravity, Urine: 1.03 (ref 1.005–1.030)
pH: 6 (ref 5.0–8.0)

## 2013-05-18 LAB — POCT I-STAT, CHEM 8
BUN: 7 mg/dL (ref 6–23)
Creatinine, Ser: 0.7 mg/dL (ref 0.50–1.10)
HCT: 36 % (ref 36.0–46.0)
Hemoglobin: 12.2 g/dL (ref 12.0–15.0)
Potassium: 3.7 mEq/L (ref 3.5–5.1)
Sodium: 138 mEq/L (ref 135–145)
TCO2: 26 mmol/L (ref 0–100)

## 2013-05-18 LAB — URINE MICROSCOPIC-ADD ON

## 2013-05-18 LAB — POCT PREGNANCY, URINE
Preg Test, Ur: NEGATIVE
Preg Test, Ur: NEGATIVE

## 2013-05-18 MED ORDER — CEFTRIAXONE SODIUM 1 G IJ SOLR
1.0000 g | Freq: Once | INTRAMUSCULAR | Status: AC
  Administered 2013-05-18: 1 g via INTRAVENOUS
  Filled 2013-05-18: qty 10

## 2013-05-18 MED ORDER — IBUPROFEN 800 MG PO TABS: 800.0000 mg | ORAL_TABLET | Freq: Three times a day (TID) | ORAL | Status: DC | PRN

## 2013-05-18 MED ORDER — SODIUM CHLORIDE 0.9 % IV BOLUS (SEPSIS)
1000.0000 mL | Freq: Once | INTRAVENOUS | Status: AC
  Administered 2013-05-18: 1000 mL via INTRAVENOUS

## 2013-05-18 MED ORDER — KETOROLAC TROMETHAMINE 30 MG/ML IJ SOLN
30.0000 mg | Freq: Once | INTRAMUSCULAR | Status: AC
  Administered 2013-05-18: 30 mg via INTRAVENOUS
  Filled 2013-05-18: qty 1

## 2013-05-18 MED ORDER — ONDANSETRON HCL 4 MG/2ML IJ SOLN
4.0000 mg | Freq: Once | INTRAMUSCULAR | Status: AC
  Administered 2013-05-18: 4 mg via INTRAVENOUS
  Filled 2013-05-18: qty 2

## 2013-05-18 MED ORDER — SODIUM CHLORIDE 0.9 % IV BOLUS (SEPSIS)
500.0000 mL | Freq: Once | INTRAVENOUS | Status: AC
  Administered 2013-05-18: 500 mL via INTRAVENOUS

## 2013-05-18 MED ORDER — ACETAMINOPHEN 325 MG PO TABS
650.0000 mg | ORAL_TABLET | Freq: Once | ORAL | Status: AC
  Administered 2013-05-18: 650 mg via ORAL
  Filled 2013-05-18: qty 2

## 2013-05-18 MED ORDER — CEPHALEXIN 500 MG PO CAPS: 500.0000 mg | ORAL_CAPSULE | Freq: Three times a day (TID) | ORAL | Status: DC

## 2013-05-18 MED ORDER — ONDANSETRON HCL 4 MG PO TABS: 4.0000 mg | ORAL_TABLET | Freq: Three times a day (TID) | ORAL | Status: DC | PRN

## 2013-05-18 NOTE — ED Notes (Signed)
Pt left w/o being seen

## 2013-05-18 NOTE — ED Notes (Addendum)
Patient reports that she woke at 0500 today with a fever of 103.0 and body aches. Patient states she took 2 Aleve tabs prior to arrival. Patient also c/o dysuria x 1 week. Patient states she has been taking OTC Uristat with no relief. Patient denies vaginal discharge.

## 2013-05-18 NOTE — ED Provider Notes (Signed)
History     CSN: [id number] Arrival date & time 05/18/13  1125  First MD Initiated Contact with Patient 05/18/13 1219     Chief Complaint  Patient presents with  . Urinary Tract Infection    HPI Comments: Patient is a 32 year old otherwise healthy female who presents with a one-week history of dysuria and frequency.  She reports this morning she woke up with nausea and had an episode of vomiting.  She has had fevers and chills.  Her temperature is been taking multiple times morning his range between 100-103oF.  She denies any flank pain.  She denies any prior history of urinary tract infection, recent catheterization or known kidney stones.  She tried taking Uryostat over the counter last week but did not get relief.  She took Aleve this morning prior to her temperature being elevated.  Patient is a 32 y.o. female presenting with urinary tract infection.  Urinary Tract Infection The current episode started in the past 7 days (dysuria X 7 days, fevers chills X 1). The problem occurs constantly. The problem has been gradually worsening. Associated symptoms include chills, fatigue, a fever, headaches, nausea and vomiting. Pertinent negatives include no abdominal pain, chest pain, congestion, coughing, neck pain or weakness.    Past Medical History  Diagnosis Date  . Anxiety   . Asthma   . Hemorrhoids     Past Surgical History  Procedure Laterality Date  . Tubal ligation      Family History  Problem Relation Age of Onset  . Cancer Father     History  Substance Use Topics  . Smoking status: Never Smoker   . Smokeless tobacco: Never Used  . Alcohol Use: No    OB History   Grav Para Term Preterm Abortions TAB SAB Ect Mult Living                  Review of Systems  Constitutional: Positive for fever, chills, activity change and fatigue.  HENT: Negative for congestion, neck pain and neck stiffness.   Respiratory: Negative for cough, choking, chest tightness, shortness of  breath and wheezing.   Cardiovascular: Negative for chest pain and palpitations.  Gastrointestinal: Positive for nausea and vomiting. Negative for abdominal pain, diarrhea, constipation, blood in stool, abdominal distention and anal bleeding.  Endocrine: Negative.   Genitourinary: Positive for dysuria. Negative for flank pain, decreased urine volume, genital sores, menstrual problem and pelvic pain.  Musculoskeletal: Negative.   Skin: Negative.   Neurological: Positive for headaches. Negative for dizziness, syncope, weakness and light-headedness.  Hematological: Negative.   Psychiatric/Behavioral: Negative.     Allergies  Review of patient's allergies indicates no known allergies.  Home Medications   Current Outpatient Rx  Name  Route  Sig  Dispense  Refill  . albuterol (PROVENTIL HFA;VENTOLIN HFA) 108 (90 BASE) MCG/ACT inhaler   Inhalation   Inhale 2 puffs into the lungs every 6 (six) hours as needed (asthma).         . cephALEXin (KEFLEX) 500 MG capsule   Oral   Take 1 capsule (500 mg total) by mouth 3 (three) times daily.   42 capsule   0   . ibuprofen (ADVIL,MOTRIN) 800 MG tablet   Oral   Take 1 tablet (800 mg total) by mouth every 8 (eight) hours as needed for pain.   30 tablet   0   . ondansetron (ZOFRAN) 4 MG tablet   Oral   Take 1 tablet (4 mg total) by mouth  every 8 (eight) hours as needed for nausea.   20 tablet   0     BP 97/63  Pulse 116  Temp(Src) 100.7 F (38.2 C) (Oral)  Resp 21  SpO2 98%  LMP 05/14/2013  Physical Exam  Nursing note and vitals reviewed. Constitutional: She appears well-developed and well-nourished. She appears distressed (mildly uncomfortable).  HENT:  Head: Normocephalic and atraumatic.  Eyes: Conjunctivae are normal. Right eye exhibits no discharge. Left eye exhibits no discharge. No scleral icterus.  Neck: No JVD present. No tracheal deviation present.  Cardiovascular: Normal rate, regular rhythm, normal heart sounds and  intact distal pulses.  Exam reveals no gallop and no friction rub.   No murmur heard. Pulmonary/Chest: Effort normal and breath sounds normal. No respiratory distress. She has no wheezes. She has no rales.  Abdominal: Soft. She exhibits no distension and no mass. There is no tenderness. There is no rebound and no guarding.  Musculoskeletal: Normal range of motion. She exhibits no edema.  No CVA tenderness  Neurological: She is alert. She exhibits normal muscle tone.  Skin: Skin is warm and dry. No rash noted. She is not diaphoretic. No erythema. No pallor.  Psychiatric: She has a normal mood and affect. Her behavior is normal. Judgment and thought content normal.    ED Course  Procedures (including critical care time)  Labs Reviewed  POCT I-STAT, CHEM 8 - Abnormal; Notable for the following:    Glucose, Bld 102 (*)    All other components within normal limits  POCT PREGNANCY, URINE  CG4 I-STAT (LACTIC ACID)   No results found.   1. Pyelonephritis     MDM  1240 - Patient is a 32 year old female who presents with a one-week history of dysuria and frequency.  She reports new onset of fevers and right occurs this morning with one episode of nausea and vomiting.  Texture was noted to be 102 at home and she presented initially to lessen on emergency department became to come on her own.  Urinalysis consistent with urinary tract infection and dehydration.  No CVA tenderness.  Basic labs pending we'll treat with IV fluids, IV Rocephin and Tylenol.  Pending lab results will guide further workup.  1430 - patient is reporting feeling better.  She has no further nausea or vomiting.  She is not have any further chills.  Labs are reassuring for normal kidney function.  We'll plan to finish normal saline bolus of 2.5 L, reassess vital signs and plan to discharge with 14 day course of Keflex for presumed pyelonephritis.  Return precautions discussed.  1545 - patient has remained minimally tachycardic  and slightly febrile.  Has been greater than 6 hours since last anti-inflammatory.  Will give dose of Toradol.  Patient has been ambulatory and denies any dizziness, lightheadedness or orthostatic symptoms.  No chest pain, chest pressure.  Should be done with antibiotics and IV fluids in the next one hour.  Denies any further nausea or vomiting and has been taking by mouth liquids as well.  1630 - patient reports feeling significantly better and wanted to go home.  Has been ambulatory and without dizziness, lightheadedness.  Reviewed return precautions.  Patient is taking by mouth medications well.  We'll provide antibiotics and antinausea medication.  Patient is to continue ibuprofen every 8 hours we'll give prescription for this.  Tylenol as needed.  Andrena Mews, DO 05/18/13 2139

## 2013-05-18 NOTE — ED Provider Notes (Signed)
I saw and evaluated the patient, reviewed the resident's note and I agree with the findings and plan.  Several days dysuria and urinary frequency woke with fever and vomiting today with diffuse abdominal pain and body aches with minimal if any diffuse abdominal tenderness without peritonitis.  Suspect pyelonephritis w/o sepsis.  Hurman Horn, MD 05/19/13 [phone]

## 2013-05-18 NOTE — ED Notes (Signed)
Family at bedside. 

## 2013-05-18 NOTE — ED Notes (Signed)
[name] [name], EMT at bedside attempting to draw labs. Pharmacy at bedside discussing medications with pt

## 2013-05-18 NOTE — ED Notes (Signed)
Pt. Reports having vaginal bleeding/clots with her period began on Friday.  Pt. Reports having dsyuria , pressure which began 5 days ago.   She woke up this am feeling achy and having a fever.

## 2013-05-18 NOTE — ED Notes (Signed)
MD at bedside. 

## 2013-05-19 LAB — URINE CULTURE: Colony Count: 55000

## 2013-07-02 ENCOUNTER — Emergency Department (HOSPITAL_COMMUNITY)
Admission: EM | Admit: 2013-07-02 | Discharge: 2013-07-02 | Disposition: A | Discharge status: Home / Self Care | Payer: Self-pay | Attending: Emergency Medicine | Admitting: Emergency Medicine

## 2013-07-02 ENCOUNTER — Encounter (HOSPITAL_COMMUNITY): Payer: Self-pay | Admitting: Emergency Medicine

## 2013-07-02 ENCOUNTER — Emergency Department (HOSPITAL_COMMUNITY): Payer: Self-pay

## 2013-07-02 DIAGNOSIS — J45909 Unspecified asthma, uncomplicated: Secondary | ICD-10-CM

## 2013-07-02 DIAGNOSIS — Z79899 Other long term (current) drug therapy: Secondary | ICD-10-CM | POA: Insufficient documentation

## 2013-07-02 DIAGNOSIS — Z8679 Personal history of other diseases of the circulatory system: Secondary | ICD-10-CM | POA: Insufficient documentation

## 2013-07-02 DIAGNOSIS — J45901 Unspecified asthma with (acute) exacerbation: Secondary | ICD-10-CM | POA: Insufficient documentation

## 2013-07-02 DIAGNOSIS — Z3202 Encounter for pregnancy test, result negative: Secondary | ICD-10-CM | POA: Insufficient documentation

## 2013-07-02 DIAGNOSIS — F411 Generalized anxiety disorder: Secondary | ICD-10-CM | POA: Insufficient documentation

## 2013-07-02 DIAGNOSIS — F419 Anxiety disorder, unspecified: Secondary | ICD-10-CM

## 2013-07-02 DIAGNOSIS — R42 Dizziness and giddiness: Secondary | ICD-10-CM | POA: Insufficient documentation

## 2013-07-02 DIAGNOSIS — R209 Unspecified disturbances of skin sensation: Secondary | ICD-10-CM | POA: Insufficient documentation

## 2013-07-02 LAB — BASIC METABOLIC PANEL
Calcium: 9.4 mg/dL (ref 8.4–10.5)
Creatinine, Ser: 0.65 mg/dL (ref 0.50–1.10)
GFR calc Af Amer: >90 mL/min (ref >90)
GFR calc non Af Amer: >90 mL/min (ref >90)

## 2013-07-02 LAB — CBC
MCH: 25.5 pg — ABNORMAL LOW (ref 26.0–34.0)
MCV: 81.6 fL (ref 78.0–100.0)
Platelets: 346 10*3/uL (ref 150–400)
RDW: 15.2 % (ref 11.5–15.5)

## 2013-07-02 LAB — POCT I-STAT TROPONIN I: Troponin i, poc: 0 ng/mL (ref 0.00–0.08)

## 2013-07-02 MED ORDER — LORAZEPAM 1 MG PO TABS
1.0000 mg | ORAL_TABLET | Freq: Once | ORAL | Status: AC
  Administered 2013-07-02: 1 mg via ORAL
  Filled 2013-07-02: qty 1

## 2013-07-02 MED ORDER — LORAZEPAM 0.5 MG PO TABS: 0.5000 mg | ORAL_TABLET | Freq: Three times a day (TID) | ORAL | Status: DC | PRN

## 2013-07-02 NOTE — ED Provider Notes (Signed)
History    CSN: [id number] Arrival date & time 07/02/13  1140  First MD Initiated Contact with Patient 07/02/13 1202     Chief Complaint  Patient presents with  . Chest Pain   (Consider location/radiation/quality/duration/timing/severity/associated sxs/prior Treatment) The history is provided by the patient and a friend. No language interpreter was used.  Azizah Lisle is a 32 y/o F with PMHx of asthma, anxiety, hemorrhoids presenting to the ED with chest pain and shortness of breath that started yesterday at approximately 9:00PM - as per patient. Patient reported that the chest pain is localized to the center and left side of the chest, reported that there is a constant tightness sensation with sharp pains noted when the patient inhales. Denied radiation of pain to the back and down the left arm. Reported that she was experiencing mild tingling sensation to the left hand. Patient reported that nothing makes the pain better or worse, nothing was used to medicate - patient reported that she laid down and rested to try and get over this. Associated symptoms are light-headedness. Patient reported that she has a history of anxiety and reported that she normally gets anxiety attacks when she is stressed out - which she currently is at the moment- stated that she normally presents with generalized chest pain described as a tightness sensation that is constant with intermittent sharp pains - same presentation as today. Patient denied history of HTN, DM, cardiac issues. Patient reported that she normally comes to the ED when she experiences anxiety to rule out cardiac issues - stated that she normally gets discharged with Ativan, stated that the last time she had Ativan was approximately 3-4 months ago. Reported that she gets an anxiety attack at least once per month. Denied fever, chills, back pain, neck pain, abdominal pain, vomiting, nausea, diarrhea, hematochezia, melena, fatigue, weakness, blurred vision,  headache, dizziness.  PCP none  Past Medical History  Diagnosis Date  . Anxiety   . Asthma   . Hemorrhoids    Past Surgical History  Procedure Laterality Date  . Tubal ligation     Family History  Problem Relation Age of Onset  . Cancer Father    History  Substance Use Topics  . Smoking status: Never Smoker   . Smokeless tobacco: Never Used  . Alcohol Use: No   OB History   Grav Para Term Preterm Abortions TAB SAB Ect Mult Living                 Review of Systems  Constitutional: Negative for fever and chills.  HENT: Negative for trouble swallowing and neck pain.   Eyes: Negative for visual disturbance.  Respiratory: Positive for shortness of breath. Negative for cough.   Cardiovascular: Positive for chest pain. Negative for leg swelling.  Gastrointestinal: Negative for nausea, vomiting, abdominal pain, diarrhea, blood in stool and anal bleeding.  Musculoskeletal: Negative for back pain.  Neurological: Positive for light-headedness. Negative for weakness, numbness and headaches.  All other systems reviewed and are negative.    Allergies  Review of patient's allergies indicates no known allergies.  Home Medications   Current Outpatient Rx  Name  Route  Sig  Dispense  Refill  . albuterol (PROVENTIL HFA;VENTOLIN HFA) 108 (90 BASE) MCG/ACT inhaler   Inhalation   Inhale 2 puffs into the lungs every 6 (six) hours as needed for wheezing or shortness of breath.          . Multiple Vitamins-Minerals (HAIR/SKIN/NAILS PO)   Oral  Take 1 tablet by mouth 5 (five) times daily.         Marland Kitchen LORazepam (ATIVAN) 0.5 MG tablet   Oral   Take 1 tablet (0.5 mg total) by mouth every 8 (eight) hours as needed for anxiety.   11 tablet   0    BP 107/70  Pulse 96  Temp(Src) 98.3 F (36.8 C) (Oral)  Resp 18  SpO2 100%  LMP 06/17/2013  Physical Exam  Nursing note and vitals reviewed. Constitutional: She is oriented to person, place, and time. She appears well-developed  and well-nourished. No distress.  HENT:  Head: Normocephalic and atraumatic.  Eyes: Conjunctivae and EOM are normal. Pupils are equal, round, and reactive to light. Right eye exhibits no discharge. Left eye exhibits no discharge.  Neck: Normal range of motion. Neck supple.  Cardiovascular: Normal rate, regular rhythm and normal heart sounds.  Exam reveals no friction rub.   No murmur heard. Pulses:      Radial pulses are 2+ on the right side, and 2+ on the left side.       Dorsalis pedis pulses are 2+ on the right side, and 2+ on the left side.  Negative leg and ankle swelling Negative pitting edema Negative calf tenderness upon palpation - negative Homan's sign  Pulmonary/Chest: Effort normal and breath sounds normal. No respiratory distress. She has no wheezes. She has no rales. She exhibits tenderness. She exhibits no mass, no bony tenderness, no laceration, no crepitus, no edema, no deformity and no retraction.    Chest wall tenderness reproducible upon palpation mainly localized to the sternal region and left side of chest Pulling sensation and discomfort noted when left arm in flexed position and backward tension placed - reported pulling and tightness with this motion   Musculoskeletal: Normal range of motion.  Full ROM to upper and lower extremities  Strength 5+/5+ with resistance  Lymphadenopathy:    She has no cervical adenopathy.  Neurological: She is alert and oriented to person, place, and time. No cranial nerve deficit. She exhibits normal muscle tone. Coordination normal.  Skin: Skin is warm and dry. No rash noted. She is not diaphoretic. No erythema.  Psychiatric: She has a normal mood and affect. Her behavior is normal. Thought content normal.    ED Course  Procedures (including critical care time)   Date: 07/02/2013  Rate: 83  Rhythm: normal sinus rhythm  QRS Axis: normal  Intervals: normal  ST/T Wave abnormalities: normal  Conduction Disutrbances:right bundle  branch block  Narrative Interpretation:   Old EKG Reviewed: unchanged  Medications  LORazepam (ATIVAN) tablet 1 mg (1 mg Oral Given 07/02/13 1419)   Labs Reviewed  CBC - Abnormal; Notable for the following:    Hemoglobin 10.0 (*)    HCT 32.0 (*)    MCH 25.5 (*)    All other components within normal limits  BASIC METABOLIC PANEL  POCT I-STAT TROPONIN I  POCT PREGNANCY, URINE   No results found. 1. Anxiety   2. Asthma, unspecified asthma severity, uncomplicated     MDM  Patient is a 32 y/o F presenting to the ED with chest tightness that has been ongoing since 9:00PM last night - described as a constant tightness to the center and left side of chest with intermittent episodes of sharp pain with inhalation. Patient has history of anxiety, reported that attacks occur when stressed - stated that she has been stressed out a lot recently. Pulses palpable - regular heart rhythm and rate.  Pain reproducible upon palpation to the chest wall. Strength 5+/5+. Alert and oriented x 3. EKG negative for ischemic changes. Troponins negative elevations. CBC and BMP negative findings. Negative pregnancy.  TIMI score 0 - doubt cardiac issues. Suspicion of chest discomfort to be due to anxiety in nature - same presentation as normal anxiety attacks that she has had the past. Ativan given in ED setting. Patient stable, afebrile. Discharged patient with a small dose of Ativan - discussed with patient the importance of following up as an outpatient regarding anxiety and medications, discussed with patient that she needs to follow up with [facility] and psychology to get control of anxiety, stated that ED is not meant for psych medication refills. [facility]. Discussed with patient to rest and stay hydrated. Discussed with patient to continue to monitor symptoms and if symptoms are to worsen or change to report back to the ED -strict return instructions given. Patient agreed to plan  of care, understood, all questions answered.             Raymon Mutton, PA-C 07/02/13 2101

## 2013-07-02 NOTE — ED Notes (Signed)
Pt reports 8/10 left sided chest pain. Pt reports some shortness of breath and lightheadedness. Pt denies pain radiation to arms, neck, shoulders, or back. Pt states she has a history of anxiety, however presents to ensure no-cardiac problems.

## 2013-07-02 NOTE — Progress Notes (Signed)
[facility] has seen patient and provided her with a list of primary care resources, as well as, a [employer].

## 2013-07-03 NOTE — ED Provider Notes (Signed)
Medical screening examination/treatment/procedure(s) were performed by non-physician practitioner and as supervising physician I was immediately available for consultation/collaboration.  Flint Melter, MD 07/03/13 [phone]

## 2014-04-30 ENCOUNTER — Emergency Department (HOSPITAL_COMMUNITY): Payer: Self-pay

## 2014-04-30 ENCOUNTER — Emergency Department (HOSPITAL_COMMUNITY)
Admission: EM | Admit: 2014-04-30 | Discharge: 2014-04-30 | Disposition: A | Discharge status: Home / Self Care | Payer: Self-pay | Attending: Emergency Medicine | Admitting: Emergency Medicine

## 2014-04-30 ENCOUNTER — Encounter (HOSPITAL_COMMUNITY): Payer: Self-pay | Admitting: Emergency Medicine

## 2014-04-30 DIAGNOSIS — Z3202 Encounter for pregnancy test, result negative: Secondary | ICD-10-CM | POA: Insufficient documentation

## 2014-04-30 DIAGNOSIS — R112 Nausea with vomiting, unspecified: Secondary | ICD-10-CM

## 2014-04-30 DIAGNOSIS — J45909 Unspecified asthma, uncomplicated: Secondary | ICD-10-CM | POA: Insufficient documentation

## 2014-04-30 DIAGNOSIS — R109 Unspecified abdominal pain: Secondary | ICD-10-CM

## 2014-04-30 DIAGNOSIS — R1011 Right upper quadrant pain: Secondary | ICD-10-CM | POA: Insufficient documentation

## 2014-04-30 DIAGNOSIS — F411 Generalized anxiety disorder: Secondary | ICD-10-CM | POA: Insufficient documentation

## 2014-04-30 DIAGNOSIS — R1013 Epigastric pain: Secondary | ICD-10-CM | POA: Insufficient documentation

## 2014-04-30 DIAGNOSIS — Z79899 Other long term (current) drug therapy: Secondary | ICD-10-CM | POA: Insufficient documentation

## 2014-04-30 DIAGNOSIS — Z8679 Personal history of other diseases of the circulatory system: Secondary | ICD-10-CM | POA: Insufficient documentation

## 2014-04-30 LAB — URINALYSIS, ROUTINE W REFLEX MICROSCOPIC
BILIRUBIN URINE: NEGATIVE
Glucose, UA: NEGATIVE mg/dL
HGB URINE DIPSTICK: NEGATIVE
KETONES UR: NEGATIVE mg/dL
Nitrite: NEGATIVE
PH: 5.5 (ref 5.0–8.0)
Protein, ur: NEGATIVE mg/dL
SPECIFIC GRAVITY, URINE: 1.026 (ref 1.005–1.030)
Urobilinogen, UA: 0.2 mg/dL (ref 0.0–1.0)

## 2014-04-30 LAB — COMPREHENSIVE METABOLIC PANEL
ALBUMIN: 3.7 g/dL (ref 3.5–5.2)
ALK PHOS: 52 U/L (ref 39–117)
ALT: 12 U/L (ref 0–35)
AST: 16 U/L (ref 0–37)
BILIRUBIN TOTAL: 0.3 mg/dL (ref 0.3–1.2)
BUN: 6 mg/dL (ref 6–23)
CHLORIDE: 102 meq/L (ref 96–112)
CO2: 26 meq/L (ref 19–32)
CREATININE: 0.68 mg/dL (ref 0.50–1.10)
Calcium: 9 mg/dL (ref 8.4–10.5)
GFR calc Af Amer: >90 mL/min (ref >90)
Glucose, Bld: 96 mg/dL (ref 70–99)
POTASSIUM: 3.8 meq/L (ref 3.7–5.3)
Sodium: 138 mEq/L (ref 137–147)
Total Protein: 8.1 g/dL (ref 6.0–8.3)

## 2014-04-30 LAB — URINE MICROSCOPIC-ADD ON

## 2014-04-30 LAB — CBC WITH DIFFERENTIAL/PLATELET
BASOS PCT: 0 % (ref 0–1)
Basophils Absolute: 0 10*3/uL (ref 0.0–0.1)
Eosinophils Absolute: 0.1 10*3/uL (ref 0.0–0.7)
Eosinophils Relative: 1 % (ref 0–5)
HEMATOCRIT: 33 % — AB (ref 36.0–46.0)
HEMOGLOBIN: 10.6 g/dL — AB (ref 12.0–15.0)
LYMPHS PCT: 19 % (ref 12–46)
Lymphs Abs: 2.1 10*3/uL (ref 0.7–4.0)
MCH: 25.7 pg — ABNORMAL LOW (ref 26.0–34.0)
MCHC: 32.1 g/dL (ref 30.0–36.0)
MCV: 80.1 fL (ref 78.0–100.0)
MONO ABS: 0.7 10*3/uL (ref 0.1–1.0)
MONOS PCT: 6 % (ref 3–12)
NEUTROS ABS: 8.5 10*3/uL — AB (ref 1.7–7.7)
NEUTROS PCT: 75 % (ref 43–77)
Platelets: 316 10*3/uL (ref 150–400)
RBC: 4.12 MIL/uL (ref 3.87–5.11)
RDW: 15.6 % — ABNORMAL HIGH (ref 11.5–15.5)
WBC: 11.4 10*3/uL — AB (ref 4.0–10.5)

## 2014-04-30 LAB — LIPASE, BLOOD: Lipase: 58 U/L (ref 11–59)

## 2014-04-30 LAB — POC URINE PREG, ED: Preg Test, Ur: NEGATIVE

## 2014-04-30 MED ORDER — PROMETHAZINE HCL 25 MG PO TABS: 25.0000 mg | ORAL_TABLET | Freq: Four times a day (QID) | ORAL | Status: DC | PRN

## 2014-04-30 MED ORDER — MORPHINE SULFATE 4 MG/ML IJ SOLN
4.0000 mg | Freq: Once | INTRAMUSCULAR | Status: AC
  Administered 2014-04-30: 4 mg via INTRAVENOUS
  Filled 2014-04-30: qty 1

## 2014-04-30 MED ORDER — DICYCLOMINE HCL 10 MG/5ML PO SOLN
10.0000 mg | Freq: Once | ORAL | Status: AC
  Administered 2014-04-30: 10 mg via ORAL
  Filled 2014-04-30: qty 5

## 2014-04-30 MED ORDER — GI COCKTAIL ~~LOC~~
30.0000 mL | Freq: Once | ORAL | Status: AC
  Administered 2014-04-30: 30 mL via ORAL
  Filled 2014-04-30: qty 30

## 2014-04-30 MED ORDER — SODIUM CHLORIDE 0.9 % IV BOLUS (SEPSIS)
1000.0000 mL | INTRAVENOUS | Status: AC
  Administered 2014-04-30: 1000 mL via INTRAVENOUS

## 2014-04-30 MED ORDER — ONDANSETRON 8 MG PO TBDP
8.0000 mg | ORAL_TABLET | Freq: Once | ORAL | Status: AC
Start: 2014-04-30 — End: 2014-04-30
  Administered 2014-04-30: 8 mg via ORAL
  Filled 2014-04-30: qty 1

## 2014-04-30 MED ORDER — TRAMADOL HCL 50 MG PO TABS: 50.0000 mg | ORAL_TABLET | Freq: Four times a day (QID) | ORAL | Status: DC | PRN

## 2014-04-30 NOTE — ED Notes (Signed)
Pt reports n/v starting this am around 0200. Pt denies blood in vomit. Pt reports normal BM yesterday.

## 2014-04-30 NOTE — ED Provider Notes (Signed)
CSN: [id number]     Arrival date & time 04/30/14  0919 History   First MD Initiated Contact with Patient 04/30/14 [phone]     Chief Complaint  Patient presents with  . Abdominal Pain     (Consider location/radiation/quality/duration/timing/severity/associated sxs/prior Treatment) HPI Pt is a 33yo female with hx of anxiety and asthma c/o sudden onset, upper abdominal pain associated with nausea and vomiting, onset 2am this morning. Pt states she had 2 episodes of NBNB emesis this morning, with intermittent, sharp, upper abdominal pain. Symptoms subsided and pt was able to go to work, however once at work, pain, nausea and vomiting returned. Reports 4-5 episodes of emesis since onset. Pain is 7/10 at worse, sharp and shooting. Denies fever, cough, SOB, chest pain, back pain, urinary or vaginal symptoms. Denies hx of similar symptoms. Denies abdominal surgeries. Denies recent travel or sick contacts.   Past Medical History  Diagnosis Date  . Anxiety   . Asthma   . Hemorrhoids    Past Surgical History  Procedure Laterality Date  . Tubal ligation     Family History  Problem Relation Age of Onset  . Cancer Father    History  Substance Use Topics  . Smoking status: Never Smoker   . Smokeless tobacco: Never Used  . Alcohol Use: No   OB History   Grav Para Term Preterm Abortions TAB SAB Ect Mult Living                 Review of Systems  Constitutional: Negative for fever, chills, diaphoresis and appetite change.  Respiratory: Negative for shortness of breath.   Cardiovascular: Negative for chest pain.  Gastrointestinal: Positive for nausea, vomiting and abdominal pain. Negative for diarrhea and constipation.  Genitourinary: Negative for dysuria, urgency, frequency, hematuria, flank pain, vaginal bleeding, vaginal pain, menstrual problem and pelvic pain.  Musculoskeletal: Negative for back pain and myalgias.  All other systems reviewed and are negative.     Allergies  Review of  patient's allergies indicates no known allergies.  Home Medications   Prior to Admission medications   Medication Sig Start Date End Date Taking? Authorizing Provider  albuterol (PROVENTIL HFA;VENTOLIN HFA) 108 (90 BASE) MCG/ACT inhaler Inhale 2 puffs into the lungs every 6 (six) hours as needed for wheezing or shortness of breath.    Yes Historical Provider, MD  LORazepam (ATIVAN) 0.5 MG tablet Take 1 tablet (0.5 mg total) by mouth every 8 (eight) hours as needed for anxiety. 07/02/13  Yes Marissa Sciacca, PA-C  Multiple Vitamins-Minerals (HAIR/SKIN/NAILS PO) Take 1 tablet by mouth 5 (five) times daily.   Yes Historical Provider, MD   BP 108/67  Pulse 86  Temp(Src) 98.4 F (36.9 C) (Oral)  Resp 16  SpO2 99%  LMP 03/11/2014 Physical Exam  Nursing note and vitals reviewed. Constitutional: She appears well-developed and well-nourished. No distress.  Pt lying comfortably in exam bed, NAD.   HENT:  Head: Normocephalic and atraumatic.  Eyes: Conjunctivae are normal. No scleral icterus.  Neck: Normal range of motion.  Cardiovascular: Normal rate, regular rhythm and normal heart sounds.   Pulmonary/Chest: Effort normal and breath sounds normal. No respiratory distress. She has no wheezes. She has no rales. She exhibits no tenderness.  Abdominal: Soft. Bowel sounds are normal. She exhibits no distension and no mass. There is tenderness. There is no rebound and no guarding.  Soft, non-distended. Tenderness in epigastrium. No rebound, guarding, or masses. No CVAT  Musculoskeletal: Normal range of motion.  Neurological: She  is alert.  Skin: Skin is warm and dry. She is not diaphoretic.    ED Course  Procedures (including critical care time) Labs Review Labs Reviewed  CBC WITH DIFFERENTIAL - Abnormal; Notable for the following:    WBC 11.4 (*)    Hemoglobin 10.6 (*)    HCT 33.0 (*)    MCH 25.7 (*)    RDW 15.6 (*)    Neutro Abs 8.5 (*)    All other components within normal limits   URINALYSIS, ROUTINE W REFLEX MICROSCOPIC - Abnormal; Notable for the following:    APPearance CLOUDY (*)    Leukocytes, UA SMALL (*)    All other components within normal limits  URINE MICROSCOPIC-ADD ON - Abnormal; Notable for the following:    Squamous Epithelial / LPF FEW (*)    All other components within normal limits  COMPREHENSIVE METABOLIC PANEL  LIPASE, BLOOD  POC URINE PREG, ED    Imaging Review [place] Abdomen Complete  04/30/2014   CLINICAL DATA:  Abdominal pain, nausea, vomiting  EXAM: COMPLETE ABDOMINAL ULTRASOUND  COMPARISON:  CT 08/12/2012  FINDINGS: Gallbladder: Physiologically distended without stones, wall thickening, or pericholecystic fluid. Sonographer reports no sonographic Murphy's sign.  Common bile duct:  Normal in caliber, 4.80mm diameter.  Liver: Homogeneous in echotexture without focal lesion or intrahepatic bile duct dilatation.  IVC:  Negative  Pancreas:  Negative  Spleen:  No focal lesion, craniocaudal 8.6cm in length.  Right Kidney: No mass or hydronephrosis, 10.5cm in length. Parenchyma is nearly isoechoic to the adjacent liver.  Left Kidney:  No lesion or hydronephrosis, 11.0cm in length.  Abdominal aorta:  Negative  IMPRESSION: Negative.  Normal gallbladder.   Electronically Signed   By: Oley Balmaniel  Hassell M.D.   On: 04/30/2014 12:44     EKG Interpretation None      MDM   Final diagnoses:  Abdominal pain  Nausea & vomiting    Pt c/o upper abdominal pain with nausea and vomiting. VSS.  Will get labs: CBC, CMP, Lipase, UA, urine pregnancy. Low concern for surgical abdomin at this time, including cholecystitis or SOB.  Symptoms likely due to gastritis.  Started pt on IV fluids, zofran, and morphine.    11:12 AM pt still c/o abdominal pain. Nausea has improved with zofran and IV fluids.  2nd dose of morphine given. Pt is more tender to RUQ at this time. Will get abdominal U/S.  Labs: unremarkable. U/S abd-negative, normal gallbladder. Pt stated abdominal pain  improved after 2nd dose of morphine and GI cocktail. States she feels comfortable being discharged home. Not concerned for emergent process taking place at this time. Symptoms still likely due to gastritis. Rx: tramadol and phenergan. [facility] Return precautions provided. Pt verbalized understanding and agreement with tx plan.     Junius Finnerrin O'Malley, PA-C 04/30/14 1524

## 2014-04-30 NOTE — ED Notes (Signed)
Pt reports pain is not a sharp/severe. Pt awaiting US.

## 2014-04-30 NOTE — ED Notes (Signed)
Pt reports n/v, upper abd pain starting at 2am today. Denies diarrhea.

## 2014-04-30 NOTE — ED Notes (Signed)
US at bedside

## 2014-04-30 NOTE — ED Provider Notes (Signed)
Medical screening examination/treatment/procedure(s) were performed by non-physician practitioner and as supervising physician I was immediately available for consultation/collaboration.   EKG Interpretation None       Onia Shiflett K Linker, MD 04/30/14 1530 

## 2014-04-30 NOTE — ED Notes (Signed)
Pt given gingerale per order.   

## 2014-07-12 ENCOUNTER — Emergency Department (HOSPITAL_COMMUNITY): Payer: Self-pay

## 2014-07-12 ENCOUNTER — Encounter (HOSPITAL_COMMUNITY): Payer: Self-pay | Admitting: Emergency Medicine

## 2014-07-12 ENCOUNTER — Emergency Department (HOSPITAL_COMMUNITY)
Admission: EM | Admit: 2014-07-12 | Discharge: 2014-07-12 | Disposition: A | Discharge status: Home / Self Care | Payer: Self-pay | Attending: Emergency Medicine | Admitting: Emergency Medicine

## 2014-07-12 DIAGNOSIS — Z79899 Other long term (current) drug therapy: Secondary | ICD-10-CM | POA: Insufficient documentation

## 2014-07-12 DIAGNOSIS — J45909 Unspecified asthma, uncomplicated: Secondary | ICD-10-CM | POA: Insufficient documentation

## 2014-07-12 DIAGNOSIS — F411 Generalized anxiety disorder: Secondary | ICD-10-CM | POA: Insufficient documentation

## 2014-07-12 DIAGNOSIS — J3489 Other specified disorders of nose and nasal sinuses: Secondary | ICD-10-CM | POA: Insufficient documentation

## 2014-07-12 DIAGNOSIS — J189 Pneumonia, unspecified organism: Secondary | ICD-10-CM

## 2014-07-12 DIAGNOSIS — Z8679 Personal history of other diseases of the circulatory system: Secondary | ICD-10-CM | POA: Insufficient documentation

## 2014-07-12 DIAGNOSIS — J159 Unspecified bacterial pneumonia: Secondary | ICD-10-CM | POA: Insufficient documentation

## 2014-07-12 MED ORDER — AZITHROMYCIN 250 MG PO TABS: 250.0000 mg | ORAL_TABLET | Freq: Every day | ORAL | Status: DC

## 2014-07-12 MED ORDER — HYDROCODONE-ACETAMINOPHEN 7.5-325 MG/15ML PO SOLN: 15.0000 mL | ORAL | Status: DC | PRN

## 2014-07-12 NOTE — ED Notes (Signed)
Pt reports chest cold, congestion, rattling in chest and cough for 2-3 days.

## 2014-07-12 NOTE — ED Notes (Signed)
Per case management, patient needs to get one tablet of azithromycin here then the script needs to be for #6 per coupon given to patient.

## 2014-07-12 NOTE — Discharge Planning (Signed)
[facility] Community Liaison  Spoke to patient regarding primary care resources and establishing care with a provider. Patient states [place], and has no pcp at this time. [place] resource guide provided for follow up care. Patient was instructed to contact this liaison once settled for further assistance with establishing care with a pcp in her area. No other Community Liaison needs identified at this time.

## 2014-07-12 NOTE — Progress Notes (Signed)
  CARE MANAGEMENT ED NOTE 07/12/2014  Patient:  [name] [name]   Account Number:  [id number]  Date Initiated:  07/12/2014  Documentation initiated by:  Ferdinand CavaSCHETTINO,Betzaira Mentel  Subjective/Objective Assessment:   33 yo female presenting with persitent cough     Subjective/Objective Assessment Detail:     Action/Plan:   Patietn will use coupon to fill dischrage prescription for affordable price and follow up with resources for PCP   Action/Plan Detail:   Anticipated DC Date:       Status Recommendation to Physician:   Result of Recommendation:  Agreed    DC Planning Services  PCP issues  Medication Assistance  Other  CM consult    Choice offered to / List presented to:  C-1 Patient          Status of service:  Completed, signed off  ED Comments:   ED Comments Detail:  CM consulted for medications assist. CM spoke with patient regarding ED visit. Patient stated that she does not have insurance or a PCP. She stated that [place] in [name] but [name]. This CM explained that she is not eligible for the orange card because that is a services for [name] resident's only. Provided the patient with a list of [name] resources for [name] with General ElectricSS fees and Office Depotdiscount pharmacies. Also discussed and provided information on the [name] and the [name]. This CM was informed of the discharge prescription for a Zpack. This CM provided the patient with a GoodRx coupon that reduces the cost of the prescription to $10 at a [facility] which the patient stated she can afford. ED PA Lauren made aware of coupon provided.

## 2014-07-12 NOTE — ED Provider Notes (Signed)
CSN: [id number]     Arrival date & time 07/12/14  1112 History   This chart was scribed for non-physician practitioner Mellody DrownLauren Ellese Julius working with Ward GivensIva L Knapp, MD by Carl Bestelina Holson, ED Scribe. This patient was seen in room TR10C/TR10C and the patient's care was started at 11:29 AM.    Chief Complaint  Patient presents with  . Cough  . Nasal Congestion  . URI   HPI Comments: [name] [name] is a 33 y.o. female who presents to the Emergency Department complaining of constant cough that started two days ago.  She states that when she starts coughing she is unable to stop.  The patient states that her symptoms are worse in the morning and at night.  She denies fever, nausea, vomiting, sore throat, rash and nasal congestion as associated symptoms.  She denies any sick contacts.  The patient states that she tried Mucinex and Zicam with no relief to her symptoms.  She denies having a history of allergies to any medication.  She states that her LNMP was last week.  The patient denies having a PCP.    Patient is a 33 y.o. female presenting with cough and URI. The history is provided by the patient. No language interpreter was used.  Cough Associated symptoms: no chills, no fever, no rash, no shortness of breath and no sore throat   URI Presenting symptoms: cough   Presenting symptoms: no congestion, no fever and no sore throat     Past Medical History  Diagnosis Date  . Anxiety   . Asthma   . Hemorrhoids    Past Surgical History  Procedure Laterality Date  . Tubal ligation     Family History  Problem Relation Age of Onset  . Cancer Father    History  Substance Use Topics  . Smoking status: Never Smoker   . Smokeless tobacco: Never Used  . Alcohol Use: No   OB History   Grav Para Term Preterm Abortions TAB SAB Ect Mult Living                 Review of Systems  Constitutional: Negative for fever and chills.  HENT: Negative for congestion and sore throat.   Respiratory: Positive for  cough. Negative for shortness of breath.   Gastrointestinal: Negative for nausea, vomiting and abdominal pain.  Skin: Negative for rash.  Allergic/Immunologic: Negative for environmental allergies and food allergies.  All other systems reviewed and are negative.     Allergies  Review of patient's allergies indicates no known allergies.  Home Medications   Prior to Admission medications   Medication Sig Start Date End Date Taking? Authorizing Provider  albuterol (PROVENTIL HFA;VENTOLIN HFA) 108 (90 BASE) MCG/ACT inhaler Inhale 2 puffs into the lungs every 6 (six) hours as needed for wheezing or shortness of breath.     Historical Provider, MD  LORazepam (ATIVAN) 0.5 MG tablet Take 1 tablet (0.5 mg total) by mouth every 8 (eight) hours as needed for anxiety. 07/02/13   Marissa Sciacca, PA-C  Multiple Vitamins-Minerals (HAIR/SKIN/NAILS PO) Take 1 tablet by mouth 5 (five) times daily.    Historical Provider, MD  promethazine (PHENERGAN) 25 MG tablet Take 1 tablet (25 mg total) by mouth every 6 (six) hours as needed for nausea or vomiting. 04/30/14   Junius FinnerErin O'Malley, PA-C  traMADol (ULTRAM) 50 MG tablet Take 1 tablet (50 mg total) by mouth every 6 (six) hours as needed. 04/30/14   Junius FinnerErin O'Malley, PA-C   BP 112/71  Pulse 78  Temp(Src) 98.3 F (36.8 C) (Oral)  Resp 20  Ht 5\' 6"  (1.676 m)  Wt 135 lb (61.236 kg)  BMI 21.80 kg/m2  SpO2 99%  LMP 07/05/2014 Physical Exam  Nursing note and vitals reviewed. Constitutional: She is oriented to person, place, and time. She appears well-developed and well-nourished.  Non-toxic appearance. She does not have a sickly appearance. She does not appear ill. No distress.  HENT:  Head: Normocephalic and atraumatic.  Right Ear: Hearing, tympanic membrane, external ear and ear canal normal. Tympanic membrane is not retracted and not bulging. No middle ear effusion.  Left Ear: Hearing, tympanic membrane, external ear and ear canal normal. Tympanic membrane is not  retracted and not bulging.  No middle ear effusion.  Nose: Rhinorrhea present.  Mouth/Throat: Uvula is midline, oropharynx is clear and moist and mucous membranes are normal. No oropharyngeal exudate, posterior oropharyngeal edema, posterior oropharyngeal erythema or tonsillar abscesses.  Eyes: EOM are normal. Pupils are equal, round, and reactive to light.  Neck: Normal range of motion. Neck supple.  Cardiovascular: Normal rate, regular rhythm, normal heart sounds and intact distal pulses.  Exam reveals no gallop and no friction rub.   No murmur heard. Pulmonary/Chest: Effort normal and breath sounds normal. No respiratory distress. She has no wheezes. She has no rales.  Musculoskeletal: Normal range of motion.  Lymphadenopathy:       Head (right side): No submental, no submandibular and no tonsillar adenopathy present.       Head (left side): No submental, no submandibular and no tonsillar adenopathy present.    She has no cervical adenopathy.  Neurological: She is alert and oriented to person, place, and time.  Skin: Skin is warm and dry. She is not diaphoretic.  Psychiatric: She has a normal mood and affect. Her behavior is normal.    ED Course  Procedures (including critical care time) Labs Review Labs Reviewed - No data to display  Imaging Review Dg Chest 2 View  07/12/2014   CLINICAL DATA:  Cough and congestion.  EXAM: CHEST  2 VIEW  COMPARISON:  11/09/2012.  FINDINGS: Mediastinum and hilar structures are normal. Minimal infiltrate right lung base cannot be excluded. No pleural effusion or pneumothorax. Heart size normal. No acute osseus abnormality.  IMPRESSION: Very minimal infiltrate in the right lung base cannot be excluded. Exam otherwise negative.   Electronically Signed   By: Maisie Fus  Register   On: 07/12/2014 12:31     EKG Interpretation None      MDM   Final diagnoses:  Community acquired pneumonia   Patient presents with cough, afebrile on exam no rails on  auscultation. X-ray shows small infiltration in the right side. Will prescribe antibiotics. Meds given in ED:  Medications - No data to display  Discharge Medication List as of 07/12/2014  1:36 PM    START taking these medications   Details  azithromycin (ZITHROMAX Z-PAK) 250 MG tablet Take 1 tablet (250 mg total) by mouth daily. 500mg  PO day 1, then 250mg  PO days 205, Starting 07/12/2014, Until Discontinued, Print    HYDROcodone-acetaminophen (HYCET) 7.5-325 mg/15 ml solution Take 15 mLs by mouth every 4 (four) hours as needed for moderate pain or severe pain., Starting 07/12/2014, Until Discontinued, Print       I personally performed the services described in this documentation, which was scribed in my presence. The recorded information has been reviewed and is accurate.    Clabe Seal, PA-C 07/12/14 1710

## 2014-07-12 NOTE — Discharge Instructions (Signed)
Call for a follow up appointment with a Family or Primary Care Provider.  Return if Symptoms worsen.   Take medication as prescribed.  Do not operate heavy machinery, drink alcohol while taking hycet (liquid medication).    Emergency Department Resource Guide 1) Find a Doctor and Pay Out of Pocket Although you won't have to find out who is covered by your insurance plan, it is a good idea to ask around and get recommendations. You will then need to call the office and see if the doctor you have chosen will accept you as a new patient and what types of options they offer for patients who are self-pay. Some doctors offer discounts or will set up payment plans for their patients who do not have insurance, but you will need to ask so you aren't surprised when you get to your appointment.  2) Contact Your Local Health Department Not all health departments have doctors that can see patients for sick visits, but many do, so it is worth a call to see if yours does. If you don't know where your local health department is, you can check in your phone book. The CDC also has a tool to help you locate your state's health department, and many state websites also have listings of all of their local health departments.  3) Find a Walk-in Clinic If your illness is not likely to be very severe or complicated, you may want to try a walk in clinic. These are popping up all over the country in pharmacies, drugstores, and shopping centers. They're usually staffed by nurse practitioners or physician assistants that have been trained to treat common illnesses and complaints. They're usually fairly quick and inexpensive. However, if you have serious medical issues or chronic medical problems, these are probably not your best option.  No Primary Care Doctor: - Call Health Connect at  [phone] - they can help you locate a primary care doctor that  accepts your insurance, provides certain services, etc. - Physician Referral  Service- [phone]  Chronic Pain Problems: Organization         Address  Phone   Notes  [facility]  [phone] Patients need to be referred by their primary care doctor.   Medication Assistance: Organization         Address  Phone   Notes  [facility] [address] [place], [place] [address] [phone] --Must be a resident of [place] -- Must have NO insurance coverage whatsoever (no Medicaid/ Medicare, etc.) -- The pt. MUST have a primary care doctor that directs their care regularly and follows them in the community   MedAssist  [phone]   [employer]  [phone]    Agencies that provide inexpensive medical care: Organization         Address  Phone   Notes  [facility]  [phone]   [facility]    [phone]   [facility] [address] [place], [place] [address] [phone]   [facility] [address], [place] [phone]   Planned Parenthood    [phone]   [facility]    [phone]   [facility]  [address], [place] Phone:  [phone], Fax:  [phone] Hours of Operation:  9 am - 6 pm, M-F.  Also accepts Medicaid/Medicare and self-pay.  [facility] for [place] [address], [address], [place] Phone: [phone], Fax: [phone]. Hours of Operation:  8:30 am - 5:30 pm, M-F.  Also accepts Medicaid and self-pay.  [facility] [place] [address] [place] Phone: [phone]   [place], [place], [place] [phone], Ext. [phone] Mondays & Thursdays: 7-9 AM.  First 15 patients are seen on a first come, first serve basis.    [place] Providers:  Organization         Address  Phone   Notes  [facility] [address],  [phone] Also accepts self-pay patients.  [facility] [address] [place], [place]  [phone]   [place], [address], [place] [phone]   [facility] [address] [place] [phone]   Lucianne Lei [address], [place]   [phone] Only accepts [place] Access [place] patients after they have their name applied to their card.   Self-Pay (no insurance) in [facility]:  Organization         Address  Phone   Notes  Sickle Cell Patients, [facility] [place] [phone]   [facility]gent Care [place] [phone]   [facility] [place]  [place], [place], [place] [phone]   [facility]/Dr. Osei-Bonsu  [address], [place] or [address], [place] [phone] Phone number for both [place] and [place] locations is the same.  Urgent Medical and [facility] [address] [place] [phone]   [facility] [address], [place] or [address] [phone]   [facility] [address], [place] [phone], phone; [phone], fax Sees patients 1st and 3rd Saturday of every month.  Must not qualify for public or private insurance (i.e. Medicaid, Medicare, [place] Health Choice, Veterans' Benefits)  Household income should be no more than 200% of the poverty level The clinic cannot treat you if you are pregnant or think you are pregnant  Sexually transmitted diseases are not treated at the clinic.    Dental Care: Organization         Address  Phone  Notes  [facility] [place] [phone] Accepts children up to age 97 who are enrolled in  [place] or [place]; pregnant women with a Medicaid card; and children who have applied for Medicaid or [place] Health Choice, but were declined, whose parents can pay a reduced fee at time of service.  [facility]  [address], [place] [phone] Accepts children up to age 19 who are enrolled in [place] or [place]; pregnant women with a Medicaid card; and children who have applied for Medicaid or [place] Health Choice, but were declined, whose parents can pay a reduced fee at time of service.  [facility] Access PROGRAM  [place] [phone] Patients are seen by appointment only. Walk-ins are not accepted. [place] will see patients 69 years of age and older. Monday - Tuesday (8am-5pm) Most Wednesdays (8:30-5pm) $30 per  visit, cash only  [facility] Adult [employer] PROGRAM  [address], [address] [phone] Patients are seen by appointment only. Walk-ins are not accepted. [place] will see patients 48 years of age and older. One Wednesday Evening (Monthly: Volunteer Based).  $30 per visit, cash only  [place]  [phone] for adults; Children under age 62, call Graduate Pediatric Dentistry at [phone]. Children aged 41-14, please call [phone] to request a pediatric application.  Dental services are provided in all areas of dental care including fillings, crowns and bridges, complete and partial dentures, implants, gum treatment, root canals, and extractions. Preventive care is also provided. Treatment is provided to both adults and children. Patients are selected via a lottery and there is often a waiting list.   [facility] [address] [place]  [phone] [email or web address]   [facility] [address] [place], [place] [phone], Ext. [phone] Second and Fourth Thursday of each month, opens at 6:30  AM; Clinic ends at 9 AM.  Patients are seen on a first-come first-served basis, and a limited number are seen during each clinic.   [facility]  [address] Hillard Danker [place], [place] [phone]   Eligibility Requirements You must have lived in [place], [place], or [place] counties for at least the last three months.   You cannot be eligible for state or federal sponsored [employer], including [employer], [place], or [employer].   You generally cannot be eligible for healthcare insurance through your employer.    How to apply: Eligibility screenings are held every Tuesday and Wednesday afternoon from 1:00 pm until 4:00 pm. You do not need an appointment for the interview!  [facility] [address], [place], [place]   [place] Department  [place]  [phone]    Behavioral Health Resources in the Community: Intensive Outpatient Programs Organization         Address  Phone  Notes  Ecru Dyer. [address] [place], [place] [phone]   [facility] Outpatient [address] [place], [place]   ADS: Alcohol & Drug Svcs [address] [place], [place]   [place] [address]  [place], [place] or [phone]   Substance Abuse Resources Organization         Address  Phone  Notes  Alcohol and Drug Services  [phone]   [place]  [phone]   The [place]   Chinita Pester  [phone]   Residential & Outpatient Substance Abuse Program  [phone]   Psychological Services Organization         Address  Phone  Notes  [facility] [place]  [place]  [phone]   [place] [address] [place] or  [phone]    Mobile Crisis Teams Organization         Address  Phone  Notes  [facility], Mobile Crisis Care Unit  [phone]   [facility]  [address] [place], [place]   [place] Levels [address], [place] [phone]    Self-Help/Support Groups Organization         Address  Phone  Notes  [facility] Assoc[facility] of [place] - variety of support groups  Dora Call for more information  Narcotics Anonymous (NA), [facility] [address], [employer] [place]  2 meetings at this location   Special educational needs teacher         Address  Phone  Notes  [facility]AP Residential Treatment [place],    [place]  [phone]   [facility]  [address] [place] [address]6568, [place], [place]   [place], [place] [phone] Admissions: 8am-3pm M-F  Incentives Substance [place] [address],    [place], [place] [phone]   [facility] [address] [place], [place], [place]   [facility] [address],  [place], [place]   [facility] - Intensive Outpatient [address] Kristeen Mans 22, [place] at [place], [place]   [facility] Bessie.,  [place], [place] [phone] or [phone]   [facility]idential Treatment Services (RTS) [address] [place], [place] Accepts Medicaid  [facility] [place] [address],  [place] [phone] Substance Abuse/Addiction Treatment   [facility] Organization         Address  Phone  Notes  [facility]  [phone]   Domenic Schwab, PhD [address] Arlis [place], [place]   [phone] or [phone]   [place], [place] [phone]   [facility] [address],  [place], [place] [phone] Insurance/Medicaid/sponsorship through [facility] and Families [address], [address] [place]                                    [place], [place] [phone] [place] [address], [place] [phone]    Dr. Adele Schilder  [phone]   [facility] Dept. 1) [address], [place] 2) [place] 3)  [place] [address], [place] [phone]  [phone]   [place] [phone] or [phone] (After Hours)

## 2014-07-12 NOTE — ED Notes (Signed)
Case management at bedside with patient.

## 2014-07-13 NOTE — ED Provider Notes (Signed)
Medical screening examination/treatment/procedure(s) were performed by non-physician practitioner and as supervising physician I was immediately available for consultation/collaboration.   EKG Interpretation None      Duward Allbritton, MD, FACEP   Tarshia Kot L Lilyth Lawyer, MD 07/13/14 1618 

## 2014-07-17 ENCOUNTER — Encounter (HOSPITAL_COMMUNITY): Payer: Self-pay | Admitting: Emergency Medicine

## 2014-07-17 ENCOUNTER — Emergency Department (HOSPITAL_COMMUNITY)
Admission: EM | Admit: 2014-07-17 | Discharge: 2014-07-17 | Disposition: A | Discharge status: Home / Self Care | Payer: Self-pay | Attending: Emergency Medicine | Admitting: Emergency Medicine

## 2014-07-17 DIAGNOSIS — F411 Generalized anxiety disorder: Secondary | ICD-10-CM | POA: Insufficient documentation

## 2014-07-17 DIAGNOSIS — Z8679 Personal history of other diseases of the circulatory system: Secondary | ICD-10-CM | POA: Insufficient documentation

## 2014-07-17 DIAGNOSIS — Z792 Long term (current) use of antibiotics: Secondary | ICD-10-CM | POA: Insufficient documentation

## 2014-07-17 DIAGNOSIS — F410 Panic disorder [episodic paroxysmal anxiety] without agoraphobia: Secondary | ICD-10-CM

## 2014-07-17 DIAGNOSIS — J45909 Unspecified asthma, uncomplicated: Secondary | ICD-10-CM | POA: Insufficient documentation

## 2014-07-17 MED ORDER — HYDROXYZINE HCL 25 MG PO TABS: 25.0000 mg | ORAL_TABLET | Freq: Four times a day (QID) | ORAL | Status: DC | PRN

## 2014-07-17 NOTE — Discharge Instructions (Signed)
°Emergency Department Resource Guide °1) Find a Doctor and Pay Out of Pocket °Although you won't have to find out who is covered by your insurance plan, it is a good idea to ask around and get recommendations. You will then need to call the office and see if the doctor you have chosen will accept you as a new patient and what types of options they offer for patients who are self-pay. Some doctors offer discounts or will set up payment plans for their patients who do not have insurance, but you will need to ask so you aren't surprised when you get to your appointment. ° °2) Contact Your Local Health Department °Not all health departments have doctors that can see patients for sick visits, but many do, so it is worth a call to see if yours does. If you don't know where your local health department is, you can check in your phone book. The CDC also has a tool to help you locate your state's health department, and many state websites also have listings of all of their local health departments. ° °3) Find a Walk-in Clinic °If your illness is not likely to be very severe or complicated, you may want to try a walk in clinic. These are popping up all over the country in pharmacies, drugstores, and shopping centers. They're usually staffed by nurse practitioners or physician assistants that have been trained to treat common illnesses and complaints. They're usually fairly quick and inexpensive. However, if you have serious medical issues or chronic medical problems, these are probably not your best option. ° °No Primary Care Doctor: °- Call [facility] at  832-8000 - they can help you locate a primary care doctor that  accepts your insurance, provides certain services, etc. °- Physician Referral Service- 1-800-533-3463 ° °Chronic Pain Problems: °Organization         Address  Phone   Notes  °[facility]  (336) 297-2271 Patients need to be referred by their primary care doctor.  ° °Medication  Assistance: °Organization         Address  Phone   Notes  °[name] [facility] [address] °[place], [place] [address] (336) 641-8030 --Must be a resident of [name] °-- Must have NO insurance coverage whatsoever (no Medicaid/ Medicare, etc.) °-- The pt. MUST have a primary care doctor that directs their care regularly and follows them in the community °  °[facility]  (866) 331-1348   °[employer]  (888) 892-1162   ° °Agencies that provide inexpensive medical care: °Organization         Address  Phone   Notes  °[facility]  (336) 832-8035   °[facility]    (336) 832-7272   °[facility] [address] °[place], [place] [address] (336) 832-4777   °[facility] [address], °[place] (336) 271-4999   °[facility]    (336) 373-0678   °[facility]    (336) 272-1050   °[facility] ° [address], [place] Phone:  (336) 832-4444, Fax:  (336) 832-4440 Hours of Operation:  9 am - 6 pm, M-F.  Also accepts Medicaid/Medicare and self-pay.  °[facility] ° [address], [place] Phone: (336) 832-3150, Fax: (336) 832-3151. Hours of Operation:  8:30 am - 5:30 pm, M-F.  Also accepts Medicaid and self-pay.  °[facility] [place] [address], [place] Phone: (336) 878-6027   °[facility] [address], [place], [place] (336)723-1848, Ext. 123 Mondays & Thursdays: 7-9 AM.  First 15 patients are seen on a first come, first serve basis. °  ° °Medicaid-accepting [name] Providers: ° °Organization         Address  Phone   Notes  °[facility] [address], [place] (336) 641-2100 Also accepts self-pay patients.  °[facility] [address], [place] ° (336) 856-9996   °[facility] [address], [place]  (336) 288-8857   °[facility] [address] [place] [address], [place] (336) 299-7000   °Veita Bland [address], [place]  ° (336) 373-1557 Only accepts [place] Access Medicaid patients after they have their name applied to their card.  ° °Self-Pay (no insurance) in [name]: ° °Organization         Address  Phone   Notes  °Sickle Cell Patients, [facility] [address], [place] (336) 832-1970   °[facility] [address], [place] (336) 832-4400   °[facility] ° [address], [place] (336) 992-4800   °[facility]/Dr. Osei-Bonsu ° [address] [place] [address], [place] or [address], [place] (336) 841-8500 Phone number for both [place] and [place] locations is the same.  °[facility] [address], [place] (336) 299-0000   °[facility] [address] [place] [address], [place] or [address] (336) 852-7530 °(336) 878-2260   °[facility] [address], [place] (336) 350-1642, phone; (336) 294-5005, fax Sees patients 1st and 3rd Saturday of every month.  Must not qualify for public or private insurance (i.e. Medicaid, Medicare, [place] Health Choice, Veterans' Benefits) • Household income should be no more than 200% of the poverty level •The clinic cannot treat you if you are pregnant or think you are pregnant • Sexually transmitted diseases are not treated at the clinic.  ° ° °Dental Care: °Organization         Address  Phone  Notes  °[name] [facility] [address], [place] (336) 641-6152 Accepts children up to age 21 who are enrolled in Medicaid or [place] Health Choice; pregnant women with a Medicaid card; and children who have applied for Medicaid or [place] Health Choice, but were declined, whose parents can pay a reduced fee at time of service.  °[name]  [facility] [place]  [address], [place] (336) 641-7733 Accepts children up to age 21 who are enrolled in Medicaid or [place] Health Choice; pregnant women with a Medicaid card; and children who have applied for Medicaid or [place] Health Choice, but were declined, whose parents can pay a reduced fee at time of service.  °[facility] ° [address], [place] (336) 641-4533 Patients are seen by appointment only. Walk-ins are not accepted. [facility] will see patients 18 years of age and older. °Monday - Tuesday (8am-5pm) °Most Wednesdays (8:30-5pm) °$30 per visit, cash only  °[facility] ° [address], [place] (336) 641-4533 Patients are seen by appointment only. Walk-ins are not accepted. [facility] will see patients 18 years of age and older. °One   Wednesday Evening (Monthly: Volunteer Based).  $30 per visit, cash only  °[facility]  (919) 537-3737 for adults; Children under age 4, call Graduate Pediatric Dentistry at (919) [phone]. Children aged 4-14, please call (919) 537-3737 to request a pediatric application. ° Dental services are provided in all areas of dental care including fillings, crowns and bridges, complete and partial dentures, implants, gum treatment, root canals, and extractions. Preventive care is also provided. Treatment is provided to both adults and children. °Patients are selected via a lottery and there is often a waiting list. °  °[facility] [address], °[place] ° (336) 763-8833 [email or web address] °  °[facility] [address], [place], [place] (336)723-1848, Ext. 123 Second and Fourth Thursday of each month, opens at 6:30 AM; Clinic ends at 9 AM.  Patients are seen on a first-come first-served basis, and a limited number are seen during each clinic.  ° °[facility] ° [address], [place], [place] (336) 723-7904    Eligibility Requirements °You must have lived in [place], [place], or [place] counties for at least the last three months. °  You cannot be eligible for state or federal sponsored healthcare insurance, including Veterans Administration, Medicaid, or Medicare. °  You generally cannot be eligible for healthcare insurance through your employer.  °  How to apply: °Eligibility screenings are held every Tuesday and Wednesday afternoon from 1:00 pm until 4:00 pm. You do not need an appointment for the interview!  °[facility] [address], [place], [place] 336-631-2330   °[facility]  336-342-8273   °[facility]  336-703-3100   °[facility]  336-570-6415   ° °Behavioral Health Resources in the Community: °Intensive Outpatient Programs °Organization         Address  Phone  Notes  °[facility] [address], [place], [place] 336-878-6098   °[facility] [address], [place], [place] 336-832-9800   °[facility] [address], [place], [place] ° 336-882-2125   °[name] [address],  °[place], [place] 1-800-853-5163 or 336-641-4981   °Substance Abuse Resources °Organization         Address  Phone  Notes  °[facility]  336-882-2125   °[facility]  336-784-9470   °[facility]  336-285-9073   °[facility]  336-845-3988   °Residential & Outpatient Substance Abuse Program  1-800-659-3381   °Psychological Services °Organization         Address  Phone  Notes  °[facility]  336- 832-9600   °[facility]  336- 378-7881   °[name] [address], [place] 1-800-853-5163 or 336-641-4981   ° °Mobile Crisis Teams °Organization         Address  Phone  Notes  °[facility], Mobile Crisis Care Unit  1-877-626-1772   °Assertive °[facility] ° [address]  [place], [place] 336-834-9664   °Sharon DeEsch [address] °[place] 336-554-5454   ° °Self-Help/Support Groups °Organization         Address  Phone             Notes  °[facility]  - variety of support groups  336- 373-1402 Call for more information  °Narcotics Anonymous (NA), [facility] [address], °[place]  2 meetings at this location  ° °  Residential Treatment Programs °Organization         Address  Phone  Notes  °[facility] [address],    °[place]  1-866-801-8205   °[facility] ° [address], [place], [place] 704-293-8524   °[facility] [address], [place] 336-845-3988 Admissions: 8am-3pm M-F  °[facility] [address]    °[place], [place] 336-841-1104   °[facility] [address], [place], [place] 336-379-7146   °[facility] [address]  °[place], [place] 336-285-9073   °[facility] Intensive Outpatient [address], [place], [place] 336-852-3033   °[facility] [address]  °[place], [place] 1-877-615-2722 or 336-784-9470   °[facility] [address] [place], [place] 336-227-7417 Accepts Medicaid  °[facility] [address]  °[place] 1-800-659-3381 Substance Abuse/Addiction Treatment  ° °[place] Behavioral Health Resources °Organization         Address  Phone  Notes  °[facility]  (888) 581-9988   °Julie Brannon, PhD [address] [place], [place]   (336) 349-5553 or (336) 951-0000   °[facility]   [address] °[place], [place] (336) 349-4454   °[facility] [address], [place], [place] (336) 342-8316 Insurance/Medicaid/sponsorship through [facility]  °[facility] [address]                                    [place], [place] (336) 342-8316 Therapy/tele-psych/case    °[facility] [address]  ° [place], [place] (336) 349-2233    °Dr. Arfeen  (336) 349-4544   °[facility] [place]  [employer] [place] [facility] 1) [address], [place] °2) [address], [place] °3)  [address], [place] (336) 349-3220 °(336) 342-7768 ° °(336) 342-8140   °[place] [facility] (336) 342-1394 or (336) 342-3537 (After Hours)    ° ° ° ° °Panic Attacks °Panic attacks are sudden, short-lived surges of severe anxiety, fear, or discomfort. They may occur for no reason when you are relaxed, when you are anxious, or when you are sleeping. Panic attacks may occur for a number of reasons:  °· Healthy people occasionally have panic attacks in extreme, life-threatening situations, such as war or natural disasters. Normal anxiety is a protective mechanism of the body that helps us react to danger (fight or flight response). °· Panic attacks are often seen with anxiety disorders, such as panic disorder, social anxiety disorder, generalized anxiety disorder, and phobias. Anxiety disorders cause excessive or uncontrollable anxiety. They may interfere with your relationships or other life activities. °· Panic attacks are sometimes seen with other mental illnesses, such as depression and posttraumatic stress disorder. °· Certain medical conditions, prescription medicines, and drugs of abuse can cause panic attacks. °SYMPTOMS  °Panic attacks start suddenly, peak within 20 minutes, and are accompanied by four or more of the following symptoms: °· Pounding heart or fast heart rate (palpitations). °· Sweating. °· Trembling or shaking. °· Shortness of breath or feeling smothered. °· Feeling choked. °· Chest pain or discomfort. °· Nausea or strange feeling in your stomach. °· Dizziness, light-headedness, or feeling like you will faint. °· Chills or hot flushes. °· Numbness or tingling in your lips or hands and feet. °·   Feeling that things are not real or feeling  that you are not yourself. °· Fear of losing control or going crazy. °· Fear of dying. °Some of these symptoms can mimic serious medical conditions. For example, you may think you are having a heart attack. Although panic attacks can be very scary, they are not life threatening. °DIAGNOSIS  °Panic attacks are diagnosed through an assessment by your health care provider. Your health care provider will ask questions about your symptoms, such as where and when they occurred. Your health care provider will also ask about your medical history and use of alcohol and drugs, including prescription medicines. Your health care provider may order blood tests or other studies to rule out a serious medical condition. Your health care provider may refer you to a mental health professional for further evaluation. °TREATMENT  °· Most healthy people who have one or two panic attacks in an extreme, life-threatening situation will not require treatment. °· The treatment for panic attacks associated with anxiety disorders or other mental illness typically involves counseling with a mental health professional, medicine, or a combination of both. Your health care provider will help determine what treatment is best for you. °· Panic attacks due to physical illness usually go away with treatment of the illness. If prescription medicine is causing panic attacks, talk with your health care provider about stopping the medicine, decreasing the dose, or substituting another medicine. °· Panic attacks due to alcohol or drug abuse go away with abstinence. Some adults need professional help in order to stop drinking or using drugs. °HOME CARE INSTRUCTIONS  °· Take all medicines as directed by your health care provider.   °· Schedule and attend follow-up visits as directed by your health care provider. It is important to keep all your appointments. °SEEK MEDICAL CARE IF: °· You are not able to take your medicines as prescribed. °· Your symptoms do  not improve or get worse. °SEEK IMMEDIATE MEDICAL CARE IF:  °· You experience panic attack symptoms that are different than your usual symptoms. °· You have serious thoughts about hurting yourself or others. °· You are taking medicine for panic attacks and have a serious side effect. °MAKE SURE YOU: °· Understand these instructions. °· Will watch your condition. °· Will get help right away if you are not doing well or get worse. °Document Released: 12/09/2005 Document Revised: 12/14/2013 Document Reviewed: 07/23/2013 °ExitCare® Patient Information ©2015 [employer]. This information is not intended to replace advice given to you by your health care provider. Make sure you discuss any questions you have with your health care provider. ° °
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
## 2014-07-17 NOTE — ED Notes (Signed)
Pt escorted to discharge window. Pt verbalized understanding discharge instructions. In no acute distress.  
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
## 2014-07-17 NOTE — ED Notes (Signed)
Bed: WLPT4 Expected date:  Expected time:  Means of arrival:  Comments: ems 

## 2014-07-26 NOTE — ED Provider Notes (Signed)
Medical screening examination/treatment/procedure(s) were performed by non-physician practitioner and as supervising physician I was immediately available for consultation/collaboration.   EKG Interpretation   Date/Time:  Sunday July 17 2014 12:54:59 EDT Ventricular Rate:  85 PR Interval:  160 QRS Duration: 109 QT Interval:  358 QTC Calculation: 426 R Axis:   67 Text Interpretation:  Sinus rhythm Right atrial enlargement RSR' in V1 or  V2, probably normal variant ST elev, probable normal early repol pattern  since last tracing no significant change Confirmed by Effie ShyWENTZ  MD, ELLIOTT  [phone]([id number]) on 07/17/2014 1:08:42 PM      Devoria AlbeIva Kingstyn Deruiter, MD, [facility] DellFACEP   [name] [name] L [name] Hodzic, MD 07/26/14 [phone]

## 2015-06-18 ENCOUNTER — Emergency Department (HOSPITAL_COMMUNITY): Payer: Self-pay

## 2015-06-18 ENCOUNTER — Encounter (HOSPITAL_COMMUNITY): Payer: Self-pay | Admitting: *Deleted

## 2015-06-18 ENCOUNTER — Emergency Department (HOSPITAL_COMMUNITY)
Admission: EM | Admit: 2015-06-18 | Discharge: 2015-06-18 | Disposition: A | Discharge status: Home / Self Care | Payer: Self-pay | Attending: Emergency Medicine | Admitting: Emergency Medicine

## 2015-06-18 DIAGNOSIS — R531 Weakness: Secondary | ICD-10-CM | POA: Insufficient documentation

## 2015-06-18 DIAGNOSIS — J45909 Unspecified asthma, uncomplicated: Secondary | ICD-10-CM | POA: Insufficient documentation

## 2015-06-18 DIAGNOSIS — M25512 Pain in left shoulder: Secondary | ICD-10-CM | POA: Insufficient documentation

## 2015-06-18 MED ORDER — NAPROXEN 500 MG PO TABS: 500.0000 mg | ORAL_TABLET | Freq: Two times a day (BID) | ORAL | Status: DC

## 2015-06-18 MED ORDER — METHOCARBAMOL 500 MG PO TABS: 500.0000 mg | ORAL_TABLET | Freq: Four times a day (QID) | ORAL | Status: DC

## 2015-06-18 NOTE — ED Notes (Signed)
Pt reports Lt shoulder pain that started on SAt. Pt denies injury.

## 2015-06-18 NOTE — ED Notes (Signed)
Declined W/C at D/C and was escorted to lobby by RN. 

## 2015-06-18 NOTE — Discharge Instructions (Signed)
Please read and follow all provided instructions.  Your diagnoses today include:  1. Left shoulder pain     Tests performed today include:  An x-ray of the affected area - does NOT show any broken bones  Vital signs. See below for your results today.   Medications prescribed:   Naproxen - anti-inflammatory pain medication  Do not exceed 500mg  naproxen every 12 hours, take with food  You have been prescribed an anti-inflammatory medication or NSAID. Take with food. Take smallest effective dose for the shortest duration needed for your pain. Stop taking if you experience stomach pain or vomiting.    Robaxin (methocarbamol) - muscle relaxer medication  DO NOT drive or perform any activities that require you to be awake and alert because this medicine can make you drowsy.   Take any prescribed medications only as directed.  Home care instructions:   Follow any educational materials contained in this packet  Follow R.I.C.E. Protocol:  R - rest your injury   I  - use ice on injury without applying directly to skin  C - compress injury with bandage or splint  E - elevate the injury as much as possible  Follow-up instructions: Please follow-up with your primary care provider or the provided orthopedic physician (bone specialist) if you continue to have significant pain in 1 week. In this case you may have a more severe injury that requires further care.   Return instructions:   Please return if your fingers are numb or tingling, appear gray or blue, or you have severe pain (also elevate the arm and loosen splint or wrap if you were given one)  Please return to the Emergency Department if you experience worsening symptoms.   Please return if you have any other emergent concerns.  Additional Information:  Your vital signs today were: BP 105/65 mmHg   Pulse 91   Temp(Src) 98.1 F (36.7 C) (Oral)   Resp 16   SpO2 100%   LMP 06/04/2015 If your blood pressure (BP) was  elevated above 135/85 this visit, please have this repeated by your doctor within one month. --------------

## 2015-06-18 NOTE — ED Provider Notes (Signed)
CSN: [id number]     Arrival date & time 06/18/15  1325 History  This chart was scribed for non-physician practitioner, Rhea Bleacher, PA-C, working with Eber Hong, MD, by Ronney Lion, ED Scribe. This patient was seen in room TR05C/TR05C and the patient's care was started at 1:41 PM.    Chief Complaint  Patient presents with  . Shoulder Pain   The history is provided by the patient. No language interpreter was used.    HPI Comments: [name] [name] is a 34 y.o. female who presents to the Emergency Department complaining of constant, severe left shoulder pain radiating down the outside of her left arm that began yesterday while at rest. 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 Patient also reports associated left hand weakness today, as she dropped a bowl of grapes, and states her shoulder also feels "hot."  She states that she feels a "pinching" sensation whenever she moves. She took ibuprofen, Aleve, and Tylenol yesterday which alleviated her pain temporarily, but it returned. She denies a history of any prior shoulder injuries.  Past Medical History  Diagnosis Date  . Asthma    Past Surgical History  Procedure Laterality Date  . Tubal ligation  2006   History reviewed. No pertinent family history. History  Substance Use Topics  . Smoking status: Never Smoker   . Smokeless tobacco: Never Used  . Alcohol Use: No   OB History    No data available     Review of Systems  Constitutional: Negative for activity change.  Musculoskeletal: Positive for arthralgias (shoulder pain). Negative for back pain, joint swelling and neck pain.  Skin: Negative for wound.  Neurological: Positive for weakness (temporary). Negative for numbness.    Allergies  Review of patient's allergies indicates no known allergies.  Home Medications   Prior to Admission medications   Not on File   BP 105/65 mmHg  Pulse 91  Temp(Src)  98.1 F (36.7 C) (Oral)  Resp 16  SpO2 100%  LMP 06/04/2015   Physical Exam  Constitutional: She appears well-developed and well-nourished.  HENT:  Head: Normocephalic and atraumatic.  Eyes: Pupils are equal, round, and reactive to light.  Neck: Normal range of motion. Neck supple.  Cardiovascular: Exam reveals no decreased pulses.   Pulses:      Radial pulses are 2+ on the right side, and 2+ on the left side.  Musculoskeletal: She exhibits tenderness. She exhibits no edema.       Right shoulder: Normal.       Left shoulder: She exhibits decreased range of motion, tenderness and bony tenderness. She exhibits no swelling.       Left elbow: Normal.       Left wrist: Normal.       Cervical back: Normal. She exhibits normal range of motion, no tenderness and no bony tenderness.       Left upper arm: She exhibits no tenderness and no bony tenderness.       Left forearm: Normal.       Left hand: Normal.  Patient is able to lift her left arm above shoulder height, however slowly.   Neurological: She is alert. No sensory deficit.  Motor, sensation to light touch, and vascular distal to the injury is fully intact.   Skin: Skin is warm and dry.  Psychiatric: She has a normal mood and affect.  Nursing note and vitals reviewed.  ED Course  Procedures (including critical care time)  DIAGNOSTIC STUDIES: Oxygen Saturation is 100% on RA, normal by my interpretation.    COORDINATION OF CARE: 1:43 PM - Discussed treatment plan with pt at bedside which includes left shoulder XR and pt agreed to plan. Rest, anti-inflammatory medications, and f/u with orthopedist. Pt verbalized understanding and agreed to plan. Will also give pt a work note.   Imaging Review Dg Shoulder Left  06/18/2015   CLINICAL DATA:  Asthma.  Suprascapular pain.  EXAM: LEFT SHOULDER - 2+ VIEW  COMPARISON:  None.  FINDINGS: There is no evidence of fracture or dislocation. There is no evidence of arthropathy or other focal  bone abnormality. Soft tissues are unremarkable.  Patient's hand projects over the shoulder on the axillary view.  IMPRESSION: Negative.   Electronically Signed   By: Gaylyn Rong M.D.   On: 06/18/2015 14:28   2:51 PM - Discussed XR results and treatment plan with pt, which includes ice and rest. Advised pt to f/u with orthopedist, Dr. Eulah Pont, if needed in 1 week for persistent or worsening symptoms.    MDM   Final diagnoses:  Left shoulder pain   Patient with left shoulder pain, likely due to overuse injury. Distal sensation, motor, neuro are intact. Rice protocol and conservative management indicated with orthopedic or PCP follow-up in one week if not improving. Patient encouraged to rest and given a work note for 3 days.   I personally performed the services described in this documentation, which was scribed in my presence. The recorded information has been reviewed and is accurate.      Renne Crigler, PA-C 06/18/15 1523  Eber Hong, MD 06/29/15 [phone]

## 2015-06-19 ENCOUNTER — Encounter (HOSPITAL_COMMUNITY): Payer: Self-pay | Admitting: Emergency Medicine

## 2015-09-30 ENCOUNTER — Emergency Department (HOSPITAL_COMMUNITY)
Admission: EM | Admit: 2015-09-30 | Discharge: 2015-09-30 | Disposition: A | Discharge status: Home / Self Care | Payer: Self-pay | Attending: Emergency Medicine | Admitting: Emergency Medicine

## 2015-09-30 ENCOUNTER — Encounter (HOSPITAL_COMMUNITY): Payer: Self-pay

## 2015-09-30 DIAGNOSIS — R000 Tachycardia, unspecified: Secondary | ICD-10-CM | POA: Insufficient documentation

## 2015-09-30 DIAGNOSIS — J029 Acute pharyngitis, unspecified: Secondary | ICD-10-CM | POA: Insufficient documentation

## 2015-09-30 DIAGNOSIS — N390 Urinary tract infection, site not specified: Secondary | ICD-10-CM | POA: Insufficient documentation

## 2015-09-30 DIAGNOSIS — Z79899 Other long term (current) drug therapy: Secondary | ICD-10-CM | POA: Insufficient documentation

## 2015-09-30 DIAGNOSIS — Z8719 Personal history of other diseases of the digestive system: Secondary | ICD-10-CM | POA: Insufficient documentation

## 2015-09-30 DIAGNOSIS — J45909 Unspecified asthma, uncomplicated: Secondary | ICD-10-CM | POA: Insufficient documentation

## 2015-09-30 DIAGNOSIS — Z8659 Personal history of other mental and behavioral disorders: Secondary | ICD-10-CM | POA: Insufficient documentation

## 2015-09-30 LAB — COMPREHENSIVE METABOLIC PANEL
ALT: 19 U/L (ref 14–54)
AST: 27 U/L (ref 15–41)
Albumin: 3.8 g/dL (ref 3.5–5.0)
Alkaline Phosphatase: 51 U/L (ref 38–126)
Anion gap: 10 (ref 5–15)
BILIRUBIN TOTAL: 0.5 mg/dL (ref 0.3–1.2)
BUN: 6 mg/dL (ref 6–20)
CO2: 21 mmol/L — ABNORMAL LOW (ref 22–32)
CREATININE: 0.87 mg/dL (ref 0.44–1.00)
Calcium: 9.1 mg/dL (ref 8.9–10.3)
Chloride: 103 mmol/L (ref 101–111)
GFR calc Af Amer: >60 mL/min (ref >60)
GFR calc non Af Amer: >60 mL/min (ref >60)
Glucose, Bld: 97 mg/dL (ref 65–99)
POTASSIUM: 3.3 mmol/L — AB (ref 3.5–5.1)
Sodium: 134 mmol/L — ABNORMAL LOW (ref 135–145)
TOTAL PROTEIN: 8 g/dL (ref 6.5–8.1)

## 2015-09-30 LAB — URINALYSIS, ROUTINE W REFLEX MICROSCOPIC
Glucose, UA: NEGATIVE mg/dL
KETONES UR: 15 mg/dL — AB
NITRITE: NEGATIVE
Protein, ur: 30 mg/dL — AB
Specific Gravity, Urine: 1.029 (ref 1.005–1.030)
Urobilinogen, UA: 0.2 mg/dL (ref 0.0–1.0)
pH: 6 (ref 5.0–8.0)

## 2015-09-30 LAB — CBC WITH DIFFERENTIAL/PLATELET
BASOS ABS: 0 10*3/uL (ref 0.0–0.1)
Basophils Relative: 0 %
EOS ABS: 0 10*3/uL (ref 0.0–0.7)
EOS PCT: 0 %
HCT: 31.4 % — ABNORMAL LOW (ref 36.0–46.0)
Hemoglobin: 9.7 g/dL — ABNORMAL LOW (ref 12.0–15.0)
Lymphocytes Relative: 14 %
Lymphs Abs: 1.8 10*3/uL (ref 0.7–4.0)
MCH: 23.4 pg — ABNORMAL LOW (ref 26.0–34.0)
MCHC: 30.9 g/dL (ref 30.0–36.0)
MCV: 75.8 fL — ABNORMAL LOW (ref 78.0–100.0)
Monocytes Absolute: 0.8 10*3/uL (ref 0.1–1.0)
Monocytes Relative: 6 %
Neutro Abs: 10.5 10*3/uL — ABNORMAL HIGH (ref 1.7–7.7)
Neutrophils Relative %: 80 %
PLATELETS: 321 10*3/uL (ref 150–400)
RBC: 4.14 MIL/uL (ref 3.87–5.11)
RDW: 15.5 % (ref 11.5–15.5)
WBC: 13.1 10*3/uL — AB (ref 4.0–10.5)

## 2015-09-30 LAB — URINE MICROSCOPIC-ADD ON

## 2015-09-30 LAB — RAPID STREP SCREEN (MED CTR MEBANE ONLY): STREPTOCOCCUS, GROUP A SCREEN (DIRECT): NEGATIVE

## 2015-09-30 LAB — I-STAT CG4 LACTIC ACID, ED: Lactic Acid, Venous: 1.67 mmol/L (ref 0.5–2.0)

## 2015-09-30 MED ORDER — MORPHINE SULFATE (PF) 4 MG/ML IV SOLN
4.0000 mg | Freq: Once | INTRAVENOUS | Status: AC
Start: 2015-09-30 — End: 2015-09-30
  Administered 2015-09-30: 4 mg via INTRAVENOUS
  Filled 2015-09-30: qty 1

## 2015-09-30 MED ORDER — SODIUM CHLORIDE 0.9 % IV BOLUS (SEPSIS)
1000.0000 mL | Freq: Once | INTRAVENOUS | Status: AC
  Administered 2015-09-30: 1000 mL via INTRAVENOUS

## 2015-09-30 MED ORDER — DEXTROSE 5 % IV SOLN
1.0000 g | Freq: Once | INTRAVENOUS | Status: AC
  Administered 2015-09-30: 1 g via INTRAVENOUS
  Filled 2015-09-30: qty 10

## 2015-09-30 MED ORDER — DEXAMETHASONE SODIUM PHOSPHATE 10 MG/ML IJ SOLN
10.0000 mg | Freq: Once | INTRAMUSCULAR | Status: AC
  Administered 2015-09-30: 10 mg via INTRAVENOUS
  Filled 2015-09-30: qty 1

## 2015-09-30 MED ORDER — HYDROCODONE-ACETAMINOPHEN 7.5-325 MG/15ML PO SOLN: 15.0000 mL | Freq: Three times a day (TID) | ORAL | Status: DC | PRN

## 2015-09-30 MED ORDER — IBUPROFEN 800 MG PO TABS: 800.0000 mg | ORAL_TABLET | Freq: Three times a day (TID) | ORAL | Status: DC

## 2015-09-30 MED ORDER — PENICILLIN G BENZATHINE 1200000 UNIT/2ML IM SUSP
1.2000 10*6.[IU] | Freq: Once | INTRAMUSCULAR | Status: AC
  Administered 2015-09-30: 1.2 10*6.[IU] via INTRAMUSCULAR
  Filled 2015-09-30: qty 2

## 2015-09-30 MED ORDER — IBUPROFEN 800 MG PO TABS
800.0000 mg | ORAL_TABLET | Freq: Once | ORAL | Status: AC
  Administered 2015-09-30: 800 mg via ORAL
  Filled 2015-09-30: qty 1

## 2015-09-30 MED ORDER — CEPHALEXIN 500 MG PO CAPS: 500.0000 mg | ORAL_CAPSULE | Freq: Four times a day (QID) | ORAL | Status: DC

## 2015-09-30 NOTE — ED Notes (Signed)
Pt reports she woke up this morning around 0530 with sore throat, headache and generalized weakness and aches throughout her whole body. Denies N/V/D. Pt A&Ox4, ambulatory with steady gait.

## 2015-09-30 NOTE — ED Notes (Signed)
Called lab for follow up on urine sample, states urine being run at this time,. MD notified.

## 2015-09-30 NOTE — Discharge Instructions (Signed)
Please obtain all of your results from medical records or have your doctors office obtain the results - share them with your doctor - you should be seen at your doctors office in the next 2 days. Call today to arrange your follow up. Take the medications as prescribed. Please review all of the medicines and only take them if you do not have an allergy to them. Please be aware that if you are taking birth control pills, taking other prescriptions, ESPECIALLY ANTIBIOTICS may make the birth control ineffective - if this is the case, either do not engage in sexual activity or use alternative methods of birth control such as condoms until you have finished the medicine and your family doctor says it is OK to restart them. If you are on a blood thinner such as COUMADIN, be aware that any other medicine that you take may cause the coumadin to either work too much, or not enough - you should have your coumadin level rechecked in next 7 days if this is the case.  °?  °It is also a possibility that you have an allergic reaction to any of the medicines that you have been prescribed - Everybody reacts differently to medications and while MOST people have no trouble with most medicines, you may have a reaction such as nausea, vomiting, rash, swelling, shortness of breath. If this is the case, please stop taking the medicine immediately and contact your physician.  °?  °You should return to the ER if you develop severe or worsening symptoms.  ° ° °RESOURCE GUIDE ° °Chronic Pain Problems: °Contact [facility]  [phone] °Patients need to be referred by their primary care doctor. ° °Insufficient Money for Medicine: °Contact [employer]:  call "211."  ° °No Primary Care Doctor: °- Call [facility]  [phone] - can help you locate a primary care doctor that  accepts your insurance, provides certain services, etc. °- Physician Referral Service- 1-800-533-3463 ° °Agencies that provide inexpensive medical  care: °- [facility]  [phone] °- [facility]  [phone] °- [facility]  [phone] °- [facility]  [phone] °- [facility]  [phone] °- [facility]  [phone] ° °Medicaid-accepting [name] Providers: °- [facility]- [address] ° [phone], Mon-Fri 9am-7pm, Sat 9am-1pm °- [facility]- [address], [address] ° [phone] °- [facility]- [address], [address] ° [phone] °- [facility]- [address] [place] [address] ° [phone] °- [facility]- [address], [phone] ° Only accepts [place] Access Medicaid patients after they have their name  applied to their card ° °Self Pay (no insurance) in [name]: °- Sickle Cell Patients: Dr Eric Dean, [facility] ° [address], [phone] °- [facility]- [address] ° [phone] °      -     [facility]- [address] °      -     [facility]- see information above (Speak to Pam H if you do not have insurance) °      -  [facility] [place]- [address],  [phone] °      -  [facility]- [address] [place] [address], [phone] °      -  Dr Osei-Bonsu-  [address],   [place], [phone] °      -  [facility] - [address], [phone] °      -  [facility]- [address] [place] [address], [phone], also [address] °  [address], [phone] °      -    [facility]- [address], [phone], 1st & 3rd Saturday °       every month, 10am-1pm ° °[facility] °[address] °[place], [place] [address] °([phone] ° °[facility] °[address] °[place], [place] [address] °([phone]) [phone] ° °1) Find a Doctor and Pay Out of Pocket °Although you won't have to find out who is covered by your insurance plan, it is a good idea  to ask around and get recommendations. You will then need to call the office and see if the doctor you have chosen will accept you as a new patient and what types of options they offer for patients who are self-pay. Some doctors offer discounts or will set up payment plans for their patients who do not have insurance, but you will need to ask so you aren't surprised when you get to your appointment. ° °2) Contact Your Local Health Department °Not all health departments have doctors that can see patients for sick visits, but many do, so it is worth a call to see if yours does. If you don't know where your local health department is, you can check in your phone book. The CDC also has a tool to help you locate your state's health department, and many state websites also have listings of all of their local health departments. ° °3) Find a Walk-in Clinic °If your illness is not likely to be very severe or complicated, you may want to try a walk in clinic. These are popping up all over the country in pharmacies, drugstores, and shopping centers. They're usually staffed by nurse practitioners or physician assistants that have been trained to treat common illnesses and complaints. They're usually fairly quick and inexpensive. However, if you have serious medical issues or chronic medical problems, these are probably not your best option ° °STD Testing °- [name] [facility], [address], [place], phone [phone] or [phone].  Monday - Friday, call for an appointment. °- [name] [facility] [place], [facility], [address], [place], phone [phone] or [phone].  Monday - Friday, call for an appointment. ° °Abuse/Neglect: °- [name] Child Abuse Hotline (336) 641-3795 °- [name] Child Abuse Hotline 800-378-5315 (After Hours) ° °Emergency Shelter:  [facility] (336) 271-5985 ° °Maternity  Homes: °- [facility] (336) 275-9566 °- [facility] (704) 372-4663 ° °MRSA Hotline #:   832-7006 ° °Dental Assistance °If unable to pay or uninsured, contact:  [name] [facility] to become qualified for the adult dental clinic. ° °Patients with Medicaid: [facility] °[address], [phone] °[address], [phone] ° °If unable to pay, or uninsured, contact [name] [facility] ([phone] in [place], [phone] in [place]) to become qualified for the adult dental clinic ° °[facility] °[address] °[place], [place] [address] °(336) [phone] °[email or web address] ° °Other Low-Cost Community Dental Services: °- [facility]- [address], [place], [place], [address], [phone], Ext. 123, 2nd and 4th Thursday of the month at 6:30am.  10 clients   each day by appointment, can sometimes see walk-in patients if someone does not show for an appointment. °- [facility]- [address], [place], [place], [address], [phone] °- [facility]- [address], [place], [place], [address], [phone] °- [facility]- [phone] °- [facility]- [phone] °- [facility]- [phone] °-  °

## 2015-09-30 NOTE — ED Provider Notes (Signed)
CSN: [id number]     Arrival date & time 09/30/15  1718 History   First MD Initiated Contact with Patient 09/30/15 1741     Chief Complaint  Patient presents with  . Weakness  . Sore Throat     (Consider location/radiation/quality/duration/timing/severity/associated sxs/prior Treatment) HPI Comments: The patient is a 34 year old female, she has a history of one day of sore throat, fever, generalized weakness. This is severe, persistent, worse with swallowing. She has no other history of medical problems and states that she is otherwise well, works in [employer] around lots of other people but does not recall anybody specifically ill around her. She has had no medications prior to arrival.  Patient is a 34 y.o. female presenting with weakness and pharyngitis. The history is provided by the patient.  Weakness  Sore Throat    Past Medical History  Diagnosis Date  . Anxiety   . Hemorrhoids   . Asthma    Past Surgical History  Procedure Laterality Date  . Tubal ligation    . Tubal ligation  2006   Family History  Problem Relation Age of Onset  . Cancer Father    Social History  Substance Use Topics  . Smoking status: Never Smoker   . Smokeless tobacco: Never Used  . Alcohol Use: No   OB History    Gravida Para Term Preterm AB TAB SAB Ectopic Multiple Living   0 0 0 0 0 0 0 0       Review of Systems  Neurological: Positive for weakness.  All other systems reviewed and are negative.     Allergies  Review of patient's allergies indicates no known allergies.  Home Medications   Prior to Admission medications   Medication Sig Start Date End Date Taking? Authorizing Provider  albuterol (PROVENTIL HFA;VENTOLIN HFA) 108 (90 BASE) MCG/ACT inhaler Inhale 1 puff into the lungs every 6 (six) hours as needed for wheezing or shortness of breath.   Yes Historical Provider, MD  cephALEXin (KEFLEX) 500 MG capsule Take 1 capsule (500 mg total) by mouth 4 (four) times daily.  09/30/15   Eber Hong, MD  HYDROcodone-acetaminophen (HYCET) 7.5-325 mg/15 ml solution Take 15 mLs by mouth every 8 (eight) hours as needed for moderate pain. 09/30/15   Eber Hong, MD  ibuprofen (ADVIL,MOTRIN) 800 MG tablet Take 1 tablet (800 mg total) by mouth 3 (three) times daily. 09/30/15   Eber Hong, MD   BP 101/60 mmHg  Pulse 98  Temp(Src) 98.4 F (36.9 C) (Oral)  Resp 13  Ht 5\' 6"  (1.676 m)  Wt 142 lb (64.411 kg)  BMI 22.93 kg/m2  SpO2 99%  LMP 09/23/2015 Physical Exam  Constitutional: She appears well-developed and well-nourished. No distress.  HENT:  Head: Normocephalic and atraumatic.  Mouth/Throat: No oropharyngeal exudate.  Erythema and hypertrophy of the bilateral tonsils, exudative patches bilaterally, normal appearing tongue, moist mucous membranes, no trismus or torticollis.  Eyes: Conjunctivae and EOM are normal. Pupils are equal, round, and reactive to light. Right eye exhibits no discharge. Left eye exhibits no discharge. No scleral icterus.  Neck: Normal range of motion. Neck supple. No JVD present. No thyromegaly present.  Cardiovascular: Regular rhythm, normal heart sounds and intact distal pulses.  Exam reveals no gallop and no friction rub.   No murmur heard. Tachycardia to 125  Pulmonary/Chest: Effort normal and breath sounds normal. No respiratory distress. She has no wheezes. She has no rales.  Abdominal: Soft. Bowel sounds are normal. She exhibits  no distension and no mass. There is no tenderness.  Very soft nontender abdomen  Musculoskeletal: Normal range of motion. She exhibits no edema or tenderness.  Lymphadenopathy:    She has cervical adenopathy.  Neurological: She is alert. Coordination normal.  Skin: Skin is warm and dry. No rash noted. No erythema.  Psychiatric: She has a normal mood and affect. Her behavior is normal.  Nursing note and vitals reviewed.   ED Course  Procedures (including critical care time) Labs Review Labs Reviewed   COMPREHENSIVE METABOLIC PANEL - Abnormal; Notable for the following:    Sodium 134 (*)    Potassium 3.3 (*)    CO2 21 (*)    All other components within normal limits  CBC WITH DIFFERENTIAL/PLATELET - Abnormal; Notable for the following:    WBC 13.1 (*)    Hemoglobin 9.7 (*)    HCT 31.4 (*)    MCV 75.8 (*)    MCH 23.4 (*)    Neutro Abs 10.5 (*)    All other components within normal limits  URINALYSIS, ROUTINE W REFLEX MICROSCOPIC (NOT AT [facility]) - Abnormal; Notable for the following:    APPearance CLOUDY (*)    Hgb urine dipstick SMALL (*)    Bilirubin Urine SMALL (*)    Ketones, ur 15 (*)    Protein, ur 30 (*)    Leukocytes, UA LARGE (*)    All other components within normal limits  URINE MICROSCOPIC-ADD ON - Abnormal; Notable for the following:    Squamous Epithelial / LPF FEW (*)    All other components within normal limits  RAPID STREP SCREEN (NOT AT [facility])  CULTURE, GROUP A STREP  I-STAT CG4 LACTIC ACID, ED  POC URINE PREG, ED    Imaging Review No results found. I have personally reviewed and evaluated these images and lab results as part of my medical decision-making.    MDM   Final diagnoses:  Pharyngitis  UTI (lower urinary tract infection)    The patient has tachycardia and fever, she meets sepsis criteria however she does appear to have an isolated pharyngitis, I suspect it with antibiotics pain medication and fluids she will improve significantly. The patient has been informed of the plan and is in agreement.  UA shows UTI as well - Abx given, IVF given, fever has defervesed and pulse < 100.  Pt is well appearing and stable for d/c.  Meds given in ED:  Medications  sodium chloride 0.9 % bolus 1,000 mL (0 mLs Intravenous Stopped 09/30/15 2231)  sodium chloride 0.9 % bolus 1,000 mL (0 mLs Intravenous Stopped 09/30/15 2231)  morphine 4 MG/ML injection 4 mg (4 mg Intravenous Given 09/30/15 1810)  dexamethasone (DECADRON) injection 10 mg (10 mg Intravenous Given  09/30/15 1811)  penicillin g benzathine (BICILLIN LA) 1200000 UNIT/2ML injection 1.2 Million Units (1.2 Million Units Intramuscular Given 09/30/15 1814)  ibuprofen (ADVIL,MOTRIN) tablet 800 mg (800 mg Oral Given 09/30/15 2300)  cefTRIAXone (ROCEPHIN) 1 g in dextrose 5 % 50 mL IVPB (0 g Intravenous Stopped 09/30/15 2329)  sodium chloride 0.9 % bolus 1,000 mL (0 mLs Intravenous Stopped 09/30/15 2330)    New Prescriptions   CEPHALEXIN (KEFLEX) 500 MG CAPSULE    Take 1 capsule (500 mg total) by mouth 4 (four) times daily.   HYDROCODONE-ACETAMINOPHEN (HYCET) 7.5-325 MG/15 ML SOLUTION    Take 15 mLs by mouth every 8 (eight) hours as needed for moderate pain.   IBUPROFEN (ADVIL,MOTRIN) 800 MG TABLET    Take  1 tablet (800 mg total) by mouth 3 (three) times daily.        Eber Hong, MD 09/30/15 [phone]

## 2015-09-30 NOTE — ED Notes (Signed)
Pt verbalized understanding of d/c instructions and has no further questions. Pt stable and NAD.  

## 2015-10-04 LAB — CULTURE, GROUP A STREP

## 2016-01-16 ENCOUNTER — Encounter (HOSPITAL_COMMUNITY): Payer: Self-pay | Admitting: Emergency Medicine

## 2016-01-16 ENCOUNTER — Emergency Department (HOSPITAL_COMMUNITY)
Admission: EM | Admit: 2016-01-16 | Discharge: 2016-01-16 | Disposition: A | Discharge status: Home / Self Care | Payer: Self-pay | Attending: Emergency Medicine | Admitting: Emergency Medicine

## 2016-01-16 DIAGNOSIS — Z8659 Personal history of other mental and behavioral disorders: Secondary | ICD-10-CM | POA: Insufficient documentation

## 2016-01-16 DIAGNOSIS — Z792 Long term (current) use of antibiotics: Secondary | ICD-10-CM | POA: Insufficient documentation

## 2016-01-16 DIAGNOSIS — Z8719 Personal history of other diseases of the digestive system: Secondary | ICD-10-CM | POA: Insufficient documentation

## 2016-01-16 DIAGNOSIS — J069 Acute upper respiratory infection, unspecified: Secondary | ICD-10-CM | POA: Insufficient documentation

## 2016-01-16 DIAGNOSIS — Z79899 Other long term (current) drug therapy: Secondary | ICD-10-CM | POA: Insufficient documentation

## 2016-01-16 DIAGNOSIS — Z791 Long term (current) use of non-steroidal anti-inflammatories (NSAID): Secondary | ICD-10-CM | POA: Insufficient documentation

## 2016-01-16 DIAGNOSIS — J45901 Unspecified asthma with (acute) exacerbation: Secondary | ICD-10-CM | POA: Insufficient documentation

## 2016-01-16 DIAGNOSIS — Z7951 Long term (current) use of inhaled steroids: Secondary | ICD-10-CM | POA: Insufficient documentation

## 2016-01-16 MED ORDER — FLUTICASONE PROPIONATE 50 MCG/ACT NA SUSP: 2.0000 | Freq: Every day | NASAL | Status: DC

## 2016-01-16 MED ORDER — ALBUTEROL SULFATE HFA 108 (90 BASE) MCG/ACT IN AERS: 2.0000 | INHALATION_SPRAY | RESPIRATORY_TRACT | Status: DC | PRN

## 2016-01-16 NOTE — ED Provider Notes (Signed)
CSN: [id number]     Arrival date & time 01/16/16  2140 History  By signing my name below, I, Jarvis Morgan, attest that this documentation has been prepared under the direction and in the presence of Cheri Fowler, PA-C Electronically Signed: Jarvis Morgan, ED Scribe. 01/16/2016. 10:25 PM.     Chief Complaint  Patient presents with  . Nasal Congestion  . Headache  . Cough   The history is provided by the patient. No language interpreter was used.    HPI Comments: [name] [name] is a 35 y.o. female with a h/o asthma who presents to the Emergency Department complaining of intermittent, moderate, cough productive of clear sputum for 1 day. She reports associated HA, nasal congestion, watery eyes, sinus pressure, rhinorrhea, and mild SOB (consistent with her asthma). Pt notes she had a sore throat yesterday but that has now resolved. She has been taking Aleve, Dayquil, Nyquil and various other OTC medications with no significant relief. Pt is non smoker. She notes she need a refill of her albuterol inhaler. Endorses h/o seasonal allergies. She denies any nausea, vomiting, diarrhea, neck pain/stiffness, fever, photophobia, double vision or other associated symptoms.   Past Medical History  Diagnosis Date  . Anxiety   . Hemorrhoids   . Asthma    Past Surgical History  Procedure Laterality Date  . Tubal ligation    . Tubal ligation  2006   Family History  Problem Relation Age of Onset  . Cancer Father    Social History  Substance Use Topics  . Smoking status: Never Smoker   . Smokeless tobacco: Never Used  . Alcohol Use: No   OB History    Gravida Para Term Preterm AB TAB SAB Ectopic Multiple Living   0 0 0 0 0 0 0 0       Review of Systems A complete 10 system review of systems was obtained and all systems are negative except as noted in the HPI and PMH.     Allergies  Review of patient's allergies indicates no known allergies.  Home Medications   Prior to Admission  medications   Medication Sig Start Date End Date Taking? Authorizing Provider  albuterol (PROVENTIL HFA;VENTOLIN HFA) 108 (90 BASE) MCG/ACT inhaler Inhale 1 puff into the lungs every 6 (six) hours as needed for wheezing or shortness of breath.    Historical Provider, MD  albuterol (PROVENTIL HFA;VENTOLIN HFA) 108 (90 Base) MCG/ACT inhaler Inhale 2 puffs into the lungs every 4 (four) hours as needed for wheezing or shortness of breath. 01/16/16   Cheri Fowler, PA-C  cephALEXin (KEFLEX) 500 MG capsule Take 1 capsule (500 mg total) by mouth 4 (four) times daily. 09/30/15   Eber Hong, MD  fluticasone (FLONASE) 50 MCG/ACT nasal spray Place 2 sprays into both nostrils daily. 01/16/16   Cheri Fowler, PA-C  HYDROcodone-acetaminophen (HYCET) 7.5-325 mg/15 ml solution Take 15 mLs by mouth every 8 (eight) hours as needed for moderate pain. 09/30/15   Eber Hong, MD  ibuprofen (ADVIL,MOTRIN) 800 MG tablet Take 1 tablet (800 mg total) by mouth 3 (three) times daily. 09/30/15   Eber Hong, MD   BP 113/79 mmHg  Pulse 91  Temp(Src) 98.8 F (37.1 C) (Oral)  Resp 16  Ht  (1.676 m)  Wt 63.504 kg  BMI 22.61 kg/m2  SpO2 100%  LMP 01/09/2016 (Approximate) Physical Exam  Constitutional: She is oriented to person, place, and time. She appears well-developed and well-nourished.  Non-toxic appearance. She does not have  a sickly appearance. She does not appear ill.  HENT:  Head: Normocephalic and atraumatic.  Nose: Right sinus exhibits no maxillary sinus tenderness and no frontal sinus tenderness. Left sinus exhibits no maxillary sinus tenderness and no frontal sinus tenderness.  Mouth/Throat: Uvula is midline, oropharynx is clear and moist and mucous membranes are normal. No trismus in the jaw. No uvula swelling. No oropharyngeal exudate, posterior oropharyngeal edema, posterior oropharyngeal erythema or tonsillar abscesses.  Eyes: Conjunctivae are normal. Pupils are equal, round, and reactive to light.  Neck:  Normal range of motion. Neck supple.  Cardiovascular: Normal rate, regular rhythm and normal heart sounds.   No murmur heard. Pulmonary/Chest: Effort normal and breath sounds normal. No accessory muscle usage or stridor. No respiratory distress. She has no wheezes. She has no rhonchi. She has no rales.  Abdominal: Soft. Bowel sounds are normal. She exhibits no distension. There is no tenderness.  Musculoskeletal: Normal range of motion.  Lymphadenopathy:    She has no cervical adenopathy.  Neurological: She is alert and oriented to person, place, and time.  Speech clear without dysarthria.  Skin: Skin is warm and dry.  Psychiatric: She has a normal mood and affect. Her behavior is normal.    ED Course  Procedures (including critical care time)  DIAGNOSTIC STUDIES: Oxygen Saturation is 100% on RA, normal by my interpretation.    COORDINATION OF CARE: 10:06 PM- Will prescribe Flonase and to continue OTC medications she has been using. Return if fever develops or symptoms worsen.  Pt advised of plan for treatment and pt agrees.  Labs Review Labs Reviewed - No data to display  Imaging Review No results found.    EKG Interpretation None      MDM   Final diagnoses:  URI (upper respiratory infection)    Pt CXR not indicated. Patients symptoms are consistent with URI, likely viral etiology. Discussed that antibiotics are not indicated for viral infections. Pt will be discharged with symptomatic treatment.  Verbalizes understanding and is agreeable with plan. Pt is hemodynamically stable & in NAD prior to dc.  I personally performed the services described in this documentation, which was scribed in my presence. The recorded information has been reviewed and is accurate.     Cheri Fowler, PA-C 01/16/16 2225  Vanetta Mulders, MD 01/20/16 [phone]

## 2016-01-16 NOTE — ED Notes (Signed)
Pt ambulates independently and with steady gait at time of discharge. Discharge instructions and follow up information reviewed with patient. No other questions or concerns voiced at this time.  

## 2016-01-16 NOTE — Discharge Instructions (Signed)
Upper Respiratory Infection, Adult °Most upper respiratory infections (URIs) are a viral infection of the air passages leading to the lungs. A URI affects the nose, throat, and upper air passages. The most common type of URI is nasopharyngitis and is typically referred to as "the common cold." °URIs run their course and usually go away on their own. Most of the time, a URI does not require medical attention, but sometimes a bacterial infection in the upper airways can follow a viral infection. This is called a secondary infection. Sinus and middle ear infections are common types of secondary upper respiratory infections. °Bacterial pneumonia can also complicate a URI. A URI can worsen asthma and chronic obstructive pulmonary disease (COPD). Sometimes, these complications can require emergency medical care and may be life threatening.  °CAUSES °Almost all URIs are caused by viruses. A virus is a type of germ and can spread from one person to another.  °RISKS FACTORS °You may be at risk for a URI if:  °· You smoke.   °· You have chronic heart or lung disease. °· You have a weakened defense (immune) system.   °· You are very young or very old.   °· You have nasal allergies or asthma. °· You work in crowded or poorly ventilated areas. °· You work in health care facilities or schools. °SIGNS AND SYMPTOMS  °Symptoms typically develop 2-3 days after you come in contact with a cold virus. Most viral URIs last 7-10 days. However, viral URIs from the influenza virus (flu virus) can last 14-18 days and are typically more severe. Symptoms may include:  °· Runny or stuffy (congested) nose.   °· Sneezing.   °· Cough.   °· Sore throat.   °· Headache.   °· Fatigue.   °· Fever.   °· Loss of appetite.   °· Pain in your forehead, behind your eyes, and over your cheekbones (sinus pain). °· Muscle aches.   °DIAGNOSIS  °Your health care provider may diagnose a URI by: °· Physical exam. °· Tests to check that your symptoms are not due to  another condition such as: °¨ Strep throat. °¨ Sinusitis. °¨ Pneumonia. °¨ Asthma. °TREATMENT  °A URI goes away on its own with time. It cannot be cured with medicines, but medicines may be prescribed or recommended to relieve symptoms. Medicines may help: °· Reduce your fever. °· Reduce your cough. °· Relieve nasal congestion. °HOME CARE INSTRUCTIONS  °· Take medicines only as directed by your health care provider.   °· Gargle warm saltwater or take cough drops to comfort your throat as directed by your health care provider. °· Use a warm mist humidifier or inhale steam from a shower to increase air moisture. This may make it easier to breathe. °· Drink enough fluid to keep your urine clear or pale yellow.   °· Eat soups and other clear broths and maintain good nutrition.   °· Rest as needed.   °· Return to work when your temperature has returned to normal or as your health care provider advises. You may need to stay home longer to avoid infecting others. You can also use a face mask and careful hand washing to prevent spread of the virus. °· Increase the usage of your inhaler if you have asthma.   °· Do not use any tobacco products, including cigarettes, chewing tobacco, or electronic cigarettes. If you need help quitting, ask your health care provider. °PREVENTION  °The best way to protect yourself from getting a cold is to practice good hygiene.  °· Avoid oral or hand contact with people with cold   symptoms.   °· Wash your hands often if contact occurs.   °There is no clear evidence that vitamin C, vitamin E, echinacea, or exercise reduces the chance of developing a cold. However, it is always recommended to get plenty of rest, exercise, and practice good nutrition.  °SEEK MEDICAL CARE IF:  °· You are getting worse rather than better.   °· Your symptoms are not controlled by medicine.   °· You have chills. °· You have worsening shortness of breath. °· You have brown or red mucus. °· You have yellow or brown nasal  discharge. °· You have pain in your face, especially when you bend forward. °· You have a fever. °· You have swollen neck glands. °· You have pain while swallowing. °· You have white areas in the back of your throat. °SEEK IMMEDIATE MEDICAL CARE IF:  °· You have severe or persistent: °¨ Headache. °¨ Ear pain. °¨ Sinus pain. °¨ Chest pain. °· You have chronic lung disease and any of the following: °¨ Wheezing. °¨ Prolonged cough. °¨ Coughing up blood. °¨ A change in your usual mucus. °· You have a stiff neck. °· You have changes in your: °¨ Vision. °¨ Hearing. °¨ Thinking. °¨ Mood. °MAKE SURE YOU:  °· Understand these instructions. °· Will watch your condition. °· Will get help right away if you are not doing well or get worse. °  °This information is not intended to replace advice given to you by your health care provider. Make sure you discuss any questions you have with your health care provider. °  °Document Released: 06/04/2001 Document Revised: 04/25/2015 Document Reviewed: 03/16/2014 °Elsevier Interactive Patient Education ©2016 Elsevier Inc. ° ° °Emergency Department Resource Guide °1) Find a Doctor and Pay Out of Pocket °Although you won't have to find out who is covered by your insurance plan, it is a good idea to ask around and get recommendations. You will then need to call the office and see if the doctor you have chosen will accept you as a new patient and what types of options they offer for patients who are self-pay. Some doctors offer discounts or will set up payment plans for their patients who do not have insurance, but you will need to ask so you aren't surprised when you get to your appointment. ° °2) Contact Your Local Health Department °Not all health departments have doctors that can see patients for sick visits, but many do, so it is worth a call to see if yours does. If you don't know where your local health department is, you can check in your phone book. The CDC also has a tool to help you  locate your state's health department, and many state websites also have listings of all of their local health departments. ° °3) Find a Walk-in Clinic °If your illness is not likely to be very severe or complicated, you may want to try a walk in clinic. These are popping up all over the country in pharmacies, drugstores, and shopping centers. They're usually staffed by nurse practitioners or physician assistants that have been trained to treat common illnesses and complaints. They're usually fairly quick and inexpensive. However, if you have serious medical issues or chronic medical problems, these are probably not your best option. ° °No Primary Care Doctor: °- Call [facility] at  832-8000 - they can help you locate a primary care doctor that  accepts your insurance, provides certain services, etc. °- Physician Referral Service- 1-800-533-3463 ° °Chronic Pain Problems: °Organization           Address  Phone   Notes  °[facility]  (336) 297-2271 Patients need to be referred by their primary care doctor.  ° °Medication Assistance: °Organization         Address  Phone   Notes  °[name] [facility] [address] °[place], [place] [address] (336) 641-8030 --Must be a resident of [name] °-- Must have NO insurance coverage whatsoever (no Medicaid/ Medicare, etc.) °-- The pt. MUST have a primary care doctor that directs their care regularly and follows them in the community °  °[facility]  (866) 331-1348   °[employer]  (888) 892-1162   ° °Agencies that provide inexpensive medical care: °Organization         Address  Phone   Notes  °[facility]  (336) 832-8035   °[facility]    (336) 832-7272   °[facility] [address] °[place], [place] [address] (336) 832-4777   °[facility] [address], °[place] (336) 271-4999   °[facility]    (336) 373-0678   °[facility]    (336) 272-1050   °[facility] ° [address], [place] Phone:  (336) 832-4444, Fax:  (336) 832-4440 Hours of Operation:  9 am - 6 pm, M-F.  Also accepts Medicaid/Medicare and self-pay.  °[facility] ° [address], [place] Phone: (336) 832-3150, Fax: (336) [phone]. Hours of Operation:  8:30 am - 5:30 pm, M-F.  Also accepts Medicaid and self-pay.  °[facility] [place] [address], [place] Phone: (336) 878-6027   °[facility] [address], [place], [place] (336)723-1848, Ext. 123 Mondays & Thursdays: 7-9 AM.  First 15 patients are seen on a first come, first serve basis. °  ° °Medicaid-accepting [name] Providers: ° °Organization         Address  Phone   Notes  °[facility] [address], [place] (336) 641-2100 Also accepts self-pay patients.  °[facility] [address], [place] ° (336) 856-9996   °[facility] [address], [place] (336) 288-8857   °[facility] [address] [place] [address], [place] (336) 299-7000   °Veita Bland [address], [place]  ° (336) 373-1557 Only accepts [place] Access Medicaid patients after they have their name applied to their card.  ° °Self-Pay (no insurance) in [name]: ° °Organization         Address  Phone   Notes  °Sickle Cell Patients, [facility] [address], [place] (336) 832-1970   °[facility] [address], [place] (336) 832-4400   °[facility] ° [address], [place] (336) 992-4800   °[facility]/Dr. Osei-Bonsu ° [address] [place] [address], [place] or [address], [place] (336) [phone] Phone number for both [place] and [place] locations is the same.  °[facility] [address],  [place] (336) 299-0000   °[facility] [address] [place] [address], [place] or [address] (336) 852-7530 °(336) 878-2260   °[facility] [address], [place] (336) 350-1642, phone; (336) 294-5005, fax Sees patients 1st and 3rd Saturday of every month.  Must not qualify   for public or private insurance (i.e. Medicaid, Medicare, [place] Health Choice, Veterans' Benefits) • Household income should be no more than 200% of the poverty level •The clinic cannot treat you if you are pregnant or think you are pregnant • Sexually transmitted diseases are not treated at the clinic.  ° ° °Dental Care: °Organization         Address  Phone  Notes  °[name] [address], [place] (336) 641-6152 Accepts children up to age 21 who are enrolled in Medicaid or [place] Health Choice; pregnant women with a Medicaid card; and children who have applied for Medicaid or [place] Health Choice, but were declined, whose parents can pay a reduced fee at time of service.  °[name] [place]  [address], [place] (336) 641-7733 Accepts children up to age 21 who are enrolled in Medicaid or [place] Health Choice; pregnant women with a Medicaid card; and children who have applied for Medicaid or [place] Health Choice, but were declined, whose parents can pay a reduced fee at time of service.  °[facility] ° [address], [place] (336) 641-4533 Patients are seen by appointment only. Walk-ins are not accepted. [facility] will see patients 18 years of age and older. °Monday - Tuesday (8am-5pm) °Most Wednesdays (8:30-5pm) °$30 per visit, cash only  °[facility] ° [address], [place] (336) 641-4533 Patients are seen by appointment only. Walk-ins are not accepted. [facility] will see patients 18 years of age and older. °One Wednesday Evening  (Monthly: Volunteer Based).  $30 per visit, cash only  °[facility]  (919) 537-3737 for adults; Children under age 4, call Graduate Pediatric Dentistry at (919) [phone]. Children aged 4-14, please call (919) [phone] to request a pediatric application. ° Dental services are provided in all areas of dental care including fillings, crowns and bridges, complete and partial dentures, implants, gum treatment, root canals, and extractions. Preventive care is also provided. Treatment is provided to both adults and children. °Patients are selected via a lottery and there is often a waiting list. °  °[facility] [address], °[place] ° (336) 763-8833 [email or web address] °  °[facility] [address], [place], [place] (336)723-1848, Ext. 123 Second and Fourth Thursday of each month, opens at 6:30 AM; Clinic ends at 9 AM.  Patients are seen on a first-come first-served basis, and a limited number are seen during each clinic.  ° °[facility] ° [address], [place], [place] (336) 723-7904   Eligibility Requirements °You must have lived in [place], [place], or [place] counties for at least the last three months. °  You cannot be eligible for state or federal sponsored healthcare insurance, including Veterans Administration, Medicaid, or Medicare. °  You generally cannot be eligible for healthcare insurance through your employer.  °  How to apply: °Eligibility screenings are held every Tuesday and Wednesday afternoon from 1:00 pm until 4:00 pm. You do not need an appointment for the interview!  °[facility] [address], [place],  336-631-2330   °[facility]  336-342-8273   °[facility]  336-703-3100   °[facility]  336-570-6415   ° °Behavioral Health Resources in the Community: °Intensive Outpatient Programs °Organization         Address  Phone    Notes  °[place]  [facility] [address], [place], [place] 336-878-6098   °[facility] [address], [place], [place] 336-832-9800   °[facility] [address], [place], [place] ° 336-882-2125   °[name] [address],  °[place], [place] 1-800-853-5163 or 336-641-4981   °Substance Abuse Resources °Organization         Address  Phone  Notes  °[facility]  336-882-2125   °[facility]  336-784-9470   °[facility]  336-285-9073   °[facility]  336-845-3988   °Residential & Outpatient Substance Abuse Program  1-800-659-3381   °Psychological Services °Organization         Address  Phone  Notes  °[facility]  336- 832-9600   °[facility]  336- 378-7881   °[name] [address], [place] 1-800-853-5163 or 336-641-4981   ° °Mobile Crisis Teams °Organization         Address  Phone  Notes  °[facility], Mobile Crisis Care Unit  1-877-626-1772   °Assertive °[facility] ° [address] [place], [place] 336-834-9664   °Sharon DeEsch [address] °[place] 336-554-5454   ° °Self-Help/Support Groups °Organization         Address  Phone             Notes  °[facility] - variety of support groups  336- 373-1402 Call for more information  °Narcotics Anonymous (NA), [facility] [address], °[place]  2 meetings at this location  ° °Residential Treatment Programs °Organization         Address  Phone  Notes  °[facility] [address],    °[place]  1-866-801-8205   °[facility] ° [address], [place], [place] 704-293-8524   °[facility] [address], [place] 336-845-3988 Admissions: 8am-3pm M-F  °[facility] [address]    °[place], [place] 336-841-1104   °[facility] [address],  [place], [place] 336-379-7146   °[facility] [address]  °[place], [place] 336-285-9073   °[facility] - Intensive Outpatient [address], [place], [place] 336-852-3033   °[facility] [address]  °[place], [place] 1-877-615-2722 or 336-784-9470   °[facility] [address] [place], [place] 336-227-7417 Accepts Medicaid  °[facility] [address]  °[place] 1-800-659-3381 Substance Abuse/Addiction Treatment  ° °[place] Behavioral Health Resources °Organization         Address  Phone  Notes  °[facility]  (888) 581-9988   °Julie Brannon, PhD [address] [place], [place]   (336) 349-5553 or (336) 951-0000   °[facility]   [address] °[place], [place] (336) 349-4454   °[facility] [address], [place], [place] (336) 342-8316 Insurance/Medicaid/sponsorship through [facility]  °[facility] [address]                                    [place], [place] (336) 342-8316 Therapy/tele-psych/case  °[facility] [address]  ° [place], [place] (336) 349-2233    °Dr. Arfeen  (336) 349-4544   °[facility] [place]  [employer]   [place] [facility] 1) [address], [place] °2) [address], [place] °3)  [address], [place] (336) 349-3220 °(336) 342-7768 ° °(336) 342-8140   °[place] [facility] (336) 342-1394 or (336) 342-3537 (After Hours)    ° ° ° °

## 2016-01-16 NOTE — ED Notes (Signed)
Pt here with nasal congestion, cough, and ha since yesterday.

## 2016-03-31 ENCOUNTER — Encounter (HOSPITAL_COMMUNITY): Payer: Self-pay

## 2016-03-31 ENCOUNTER — Emergency Department (HOSPITAL_COMMUNITY)
Admission: EM | Admit: 2016-03-31 | Discharge: 2016-03-31 | Disposition: A | Discharge status: Home / Self Care | Payer: Self-pay | Attending: Emergency Medicine | Admitting: Emergency Medicine

## 2016-03-31 DIAGNOSIS — Z8659 Personal history of other mental and behavioral disorders: Secondary | ICD-10-CM | POA: Insufficient documentation

## 2016-03-31 DIAGNOSIS — Z8719 Personal history of other diseases of the digestive system: Secondary | ICD-10-CM | POA: Insufficient documentation

## 2016-03-31 DIAGNOSIS — Z3202 Encounter for pregnancy test, result negative: Secondary | ICD-10-CM | POA: Insufficient documentation

## 2016-03-31 DIAGNOSIS — R51 Headache: Secondary | ICD-10-CM | POA: Insufficient documentation

## 2016-03-31 DIAGNOSIS — Z79899 Other long term (current) drug therapy: Secondary | ICD-10-CM | POA: Insufficient documentation

## 2016-03-31 DIAGNOSIS — R531 Weakness: Secondary | ICD-10-CM | POA: Insufficient documentation

## 2016-03-31 DIAGNOSIS — R519 Headache, unspecified: Secondary | ICD-10-CM

## 2016-03-31 DIAGNOSIS — J45909 Unspecified asthma, uncomplicated: Secondary | ICD-10-CM | POA: Insufficient documentation

## 2016-03-31 LAB — CBC WITH DIFFERENTIAL/PLATELET
BASOS ABS: 0.1 10*3/uL (ref 0.0–0.1)
Basophils Relative: 1 %
EOS ABS: 0 10*3/uL (ref 0.0–0.7)
Eosinophils Relative: 0 %
HEMATOCRIT: 28.4 % — AB (ref 36.0–46.0)
Hemoglobin: 8.7 g/dL — ABNORMAL LOW (ref 12.0–15.0)
LYMPHS ABS: 3.1 10*3/uL (ref 0.7–4.0)
Lymphocytes Relative: 32 %
MCH: 21.5 pg — ABNORMAL LOW (ref 26.0–34.0)
MCHC: 30.6 g/dL (ref 30.0–36.0)
MCV: 70.1 fL — ABNORMAL LOW (ref 78.0–100.0)
MONOS PCT: 5 %
Monocytes Absolute: 0.5 10*3/uL (ref 0.1–1.0)
Neutro Abs: 6 10*3/uL (ref 1.7–7.7)
Neutrophils Relative %: 62 %
PLATELETS: 303 10*3/uL (ref 150–400)
RBC: 4.05 MIL/uL (ref 3.87–5.11)
RDW: 16.6 % — AB (ref 11.5–15.5)
WBC: 9.7 10*3/uL (ref 4.0–10.5)

## 2016-03-31 LAB — I-STAT BETA HCG BLOOD, ED (MC, WL, AP ONLY)

## 2016-03-31 LAB — BASIC METABOLIC PANEL
ANION GAP: 6 (ref 5–15)
BUN: 10 mg/dL (ref 6–20)
CALCIUM: 9.1 mg/dL (ref 8.9–10.3)
CO2: 24 mmol/L (ref 22–32)
CREATININE: 0.64 mg/dL (ref 0.44–1.00)
Chloride: 105 mmol/L (ref 101–111)
Glucose, Bld: 89 mg/dL (ref 65–99)
Potassium: 3.6 mmol/L (ref 3.5–5.1)
SODIUM: 135 mmol/L (ref 135–145)

## 2016-03-31 MED ORDER — IBUPROFEN 800 MG PO TABS: 800.0000 mg | ORAL_TABLET | Freq: Three times a day (TID) | ORAL | Status: DC

## 2016-03-31 MED ORDER — KETOROLAC TROMETHAMINE 30 MG/ML IJ SOLN
30.0000 mg | Freq: Once | INTRAMUSCULAR | Status: AC
  Administered 2016-03-31: 30 mg via INTRAVENOUS
  Filled 2016-03-31: qty 1

## 2016-03-31 MED ORDER — SODIUM CHLORIDE 0.9 % IV BOLUS (SEPSIS)
1000.0000 mL | Freq: Once | INTRAVENOUS | Status: AC
Start: 2016-03-31 — End: 2016-03-31
  Administered 2016-03-31: 1000 mL via INTRAVENOUS

## 2016-03-31 MED ORDER — DIPHENHYDRAMINE HCL 50 MG/ML IJ SOLN
12.5000 mg | Freq: Once | INTRAMUSCULAR | Status: AC
  Administered 2016-03-31: 12.5 mg via INTRAVENOUS
  Filled 2016-03-31: qty 1

## 2016-03-31 MED ORDER — DEXAMETHASONE SODIUM PHOSPHATE 10 MG/ML IJ SOLN
10.0000 mg | Freq: Once | INTRAMUSCULAR | Status: AC
  Administered 2016-03-31: 10 mg via INTRAVENOUS
  Filled 2016-03-31: qty 1

## 2016-03-31 MED ORDER — METOCLOPRAMIDE HCL 5 MG/ML IJ SOLN
10.0000 mg | Freq: Once | INTRAMUSCULAR | Status: AC
  Administered 2016-03-31: 10 mg via INTRAVENOUS
  Filled 2016-03-31: qty 2

## 2016-03-31 NOTE — ED Provider Notes (Signed)
CSN: [id number]     Arrival date & time 03/31/16  1721 History   First MD Initiated Contact with Patient 03/31/16 1729     Chief Complaint  Patient presents with  . Migraine    HPI   [name] [name] is a 35 y.o. female with a PMH of anxiety and asthma who presents to the ED with headache, which she states started last night around 8 PM and has been constant since that time. She notes gradual onset and progressive worsening of symptoms. She reports history of migraines, and notes her symptoms feel similar. She states light and sound exacerbate her pain. She has tried tylenol and ibuprofen at home with no significant symptom relief. She states she experienced generalized weakness today while at work, which she attributes to pain. She denies fever, chills, dizziness, lightheadedness, vision changes, nausea, vomiting, numbness, paresthesia.   Past Medical History  Diagnosis Date  . Anxiety   . Hemorrhoids   . Asthma    Past Surgical History  Procedure Laterality Date  . Tubal ligation    . Tubal ligation  2006   Family History  Problem Relation Age of Onset  . Cancer Father    Social History  Substance Use Topics  . Smoking status: Never Smoker   . Smokeless tobacco: Never Used  . Alcohol Use: No   OB History    Gravida Para Term Preterm AB TAB SAB Ectopic Multiple Living   0 0 0 0 0 0 0 0        Review of Systems  Constitutional: Negative for fever and chills.  Gastrointestinal: Negative for nausea and vomiting.  Neurological: Positive for weakness and headaches. Negative for dizziness, syncope, light-headedness and numbness.  All other systems reviewed and are negative.     Allergies  Review of patient's allergies indicates no known allergies.  Home Medications   Prior to Admission medications   Medication Sig Start Date End Date Taking? Authorizing Provider  acetaminophen (TYLENOL) 500 MG tablet Take 1,000 mg by mouth every 6 (six) hours as needed for moderate pain.    Yes Historical Provider, MD  albuterol (PROVENTIL HFA;VENTOLIN HFA) 108 (90 Base) MCG/ACT inhaler Inhale 2 puffs into the lungs every 4 (four) hours as needed for wheezing or shortness of breath. 01/16/16  Yes Cheri Fowler, PA-C  aspirin-acetaminophen-caffeine (EXCEDRIN MIGRAINE) [phone] MG tablet Take 2 tablets by mouth every 6 (six) hours as needed for headache.   Yes Historical Provider, MD  cephALEXin (KEFLEX) 500 MG capsule Take 1 capsule (500 mg total) by mouth 4 (four) times daily. Patient not taking: Reported on 03/31/2016 09/30/15   Eber Hong, MD  fluticasone [facility]) 50 MCG/ACT nasal spray Place 2 sprays into both nostrils daily. Patient not taking: Reported on 03/31/2016 01/16/16   Cheri Fowler, PA-C  HYDROcodone-acetaminophen (HYCET) 7.5-325 mg/15 ml solution Take 15 mLs by mouth every 8 (eight) hours as needed for moderate pain. Patient not taking: Reported on 03/31/2016 09/30/15   Eber Hong, MD  ibuprofen (ADVIL,MOTRIN) 800 MG tablet Take 1 tablet (800 mg total) by mouth 3 (three) times daily. 03/31/16   Mady Gemma, PA-C    BP 95/66 mmHg  Pulse 86  Temp(Src) 98.3 F (36.8 C) (Oral)  Resp 16  SpO2 99%  LMP 03/21/2016 (Approximate) Physical Exam  Constitutional: She is oriented to person, place, and time. She appears well-developed and well-nourished. No distress.  HENT:  Head: Normocephalic and atraumatic.  Right Ear: External ear normal.  Left Ear: External  ear normal.  Nose: Nose normal.  Mouth/Throat: Uvula is midline, oropharynx is clear and moist and mucous membranes are normal.  Eyes: Conjunctivae, EOM and lids are normal. Pupils are equal, round, and reactive to light. Right eye exhibits no discharge. Left eye exhibits no discharge. No scleral icterus.  Neck: Normal range of motion. Neck supple.  Cardiovascular: Normal rate, regular rhythm, normal heart sounds, intact distal pulses and normal pulses.   Pulmonary/Chest: Effort normal and breath sounds normal. No  respiratory distress. She has no wheezes. She has no rales.  Abdominal: Soft. Normal appearance and bowel sounds are normal. She exhibits no distension and no mass. There is no tenderness. There is no rigidity, no rebound and no guarding.  Musculoskeletal: Normal range of motion. She exhibits no edema or tenderness.  Neurological: She is alert and oriented to person, place, and time. She has normal strength. No cranial nerve deficit or sensory deficit. She displays a negative Romberg sign. Coordination and gait normal.  Skin: Skin is warm, dry and intact. No rash noted. She is not diaphoretic. No erythema. No pallor.  Psychiatric: She has a normal mood and affect. Her speech is normal and behavior is normal.  Nursing note and vitals reviewed.   ED Course  Procedures (including critical care time)  Labs Review Labs Reviewed  CBC WITH DIFFERENTIAL/PLATELET - Abnormal; Notable for the following:    Hemoglobin 8.7 (*)    HCT 28.4 (*)    MCV 70.1 (*)    MCH 21.5 (*)    RDW 16.6 (*)    All other components within normal limits  BASIC METABOLIC PANEL  I-STAT BETA HCG BLOOD, ED ([facility], [facility], [facility] ONLY)    Imaging Review No results found.   I have personally reviewed and evaluated these lab results as part of my medical decision-making.   EKG Interpretation None      MDM   Final diagnoses:  Headache, unspecified headache type    35 year old female presents with headache since 8 PM last night. Reports gradual onset and progressive worsening of symptoms. Notes generalized weakness earlier today at work, which she attributes to pain. Denies fever, chills, dizziness, lightheadedness, vision changes, nausea, vomiting, numbness, paresthesia. States she has a history of migraines and that her symptoms feel similar.  Patient is afebrile. Heart rate 106. Normal neuro exam with no focal deficit. Strength, sensation, coordination intact. Patient ambulates without difficulty or ataxia. No nuchal  rigidity. Patient ambulates without difficulty.  CBC negative for leukocytosis, hemoglobin 8.7. BMP unremarkable. Beta hCG negative. Will give fluids, benadryl, reglan, toradol, decadron.  On reassessment of patient, she reports significant symptom improvement. Patient is non-toxic and well-appearing, feel she is stable for discharge at this time. Presentation consistent with patient's history of migraine, low suspicion for ICH, SAH, meningitis. Patient to follow-up with PCP. Will give ibuprofen for home. Strict return precautions discussed. Patient verbalizes her understanding and is in agreement with plan.  BP 95/66 mmHg  Pulse 86  Temp(Src) 98.3 F (36.8 C) (Oral)  Resp 16  SpO2 99%  LMP 03/21/2016 (Approximate)      Mady GemmaElizabeth C Westfall, PA-C 04/01/16 0027  Melene Planan Floyd, DO 04/01/16 [id number]

## 2016-03-31 NOTE — Discharge Instructions (Signed)
1. Medications: ibuprofen, usual home medications 2. Treatment: rest, drink plenty of fluids 3. Follow Up: please followup with your primary doctor in 2-3 days for discussion of your diagnoses and further evaluation after today's visit; if you do not have a primary care doctor use the phone number listed in your discharge paperwork to find one; please return to the ER for severe headache, numbness, weakness, new or worsening symptoms   Migraine Headache A migraine headache is very bad, throbbing pain on one or both sides of your head. Talk to your doctor about what things may bring on (trigger) your migraine headaches. HOME CARE  Only take medicines as told by your doctor.  Lie down in a dark, quiet room when you have a migraine.  Keep a journal to find out if certain things bring on migraine headaches. For example, write down:  What you eat and drink.  How much sleep you get.  Any change to your diet or medicines.  Lessen how much alcohol you drink.  Quit smoking if you smoke.  Get enough sleep.  Lessen any stress in your life.  Keep lights dim if bright lights bother you or make your migraines worse. GET HELP RIGHT AWAY IF:   Your migraine becomes really bad.  You have a fever.  You have a stiff neck.  You have trouble seeing.  Your muscles are weak, or you lose muscle control.  You lose your balance or have trouble walking.  You feel like you will pass out (faint), or you pass out.  You have really bad symptoms that are different than your first symptoms. MAKE SURE YOU:   Understand these instructions.  Will watch your condition.  Will get help right away if you are not doing well or get worse.   This information is not intended to replace advice given to you by your health care provider. Make sure you discuss any questions you have with your health care provider.   Document Released: 09/17/2008 Document Revised: 03/02/2012 Document Reviewed:  08/16/2013 Elsevier Interactive Patient Education [employer]2016 Elsevier Inc.

## 2016-03-31 NOTE — ED Notes (Signed)
Pt presents with c/o migraine that started around 8 pm last night. Pt reports she was feeling weak at work today because of her migraine. Denies any N/V but does report an aura and light sensitivity.

## 2016-05-14 ENCOUNTER — Encounter (HOSPITAL_COMMUNITY): Payer: Self-pay | Admitting: Emergency Medicine

## 2016-05-14 ENCOUNTER — Emergency Department (HOSPITAL_COMMUNITY)
Admission: EM | Admit: 2016-05-14 | Discharge: 2016-05-14 | Disposition: A | Discharge status: Home / Self Care | Payer: Self-pay | Attending: Dermatology | Admitting: Dermatology

## 2016-05-14 DIAGNOSIS — N946 Dysmenorrhea, unspecified: Secondary | ICD-10-CM | POA: Insufficient documentation

## 2016-05-14 DIAGNOSIS — J45909 Unspecified asthma, uncomplicated: Secondary | ICD-10-CM | POA: Insufficient documentation

## 2016-05-14 DIAGNOSIS — Z5321 Procedure and treatment not carried out due to patient leaving prior to being seen by health care provider: Secondary | ICD-10-CM | POA: Insufficient documentation

## 2016-05-14 DIAGNOSIS — G43909 Migraine, unspecified, not intractable, without status migrainosus: Secondary | ICD-10-CM | POA: Insufficient documentation

## 2016-05-14 NOTE — ED Notes (Signed)
Called  No response from lobby 

## 2016-05-14 NOTE — ED Notes (Signed)
Pt states that she started her period yesterday and has had bad cramping since that time. She also has a migraine which she has a hx of . Alert and oriented. Neuro intact.

## 2016-05-14 NOTE — ED Notes (Signed)
Called to take to room  No response from lobby  

## 2016-06-06 ENCOUNTER — Emergency Department (HOSPITAL_COMMUNITY): Payer: Self-pay

## 2016-06-06 ENCOUNTER — Emergency Department (HOSPITAL_COMMUNITY)
Admission: EM | Admit: 2016-06-06 | Discharge: 2016-06-07 | Disposition: A | Discharge status: Home / Self Care | Payer: Self-pay | Attending: Emergency Medicine | Admitting: Emergency Medicine

## 2016-06-06 ENCOUNTER — Encounter (HOSPITAL_COMMUNITY): Payer: Self-pay | Admitting: Emergency Medicine

## 2016-06-06 DIAGNOSIS — Z79899 Other long term (current) drug therapy: Secondary | ICD-10-CM | POA: Insufficient documentation

## 2016-06-06 DIAGNOSIS — Z7982 Long term (current) use of aspirin: Secondary | ICD-10-CM | POA: Insufficient documentation

## 2016-06-06 DIAGNOSIS — Z5321 Procedure and treatment not carried out due to patient leaving prior to being seen by health care provider: Secondary | ICD-10-CM | POA: Insufficient documentation

## 2016-06-06 DIAGNOSIS — J45909 Unspecified asthma, uncomplicated: Secondary | ICD-10-CM | POA: Insufficient documentation

## 2016-06-06 DIAGNOSIS — R05 Cough: Secondary | ICD-10-CM | POA: Insufficient documentation

## 2016-06-06 LAB — CBC WITH DIFFERENTIAL/PLATELET
BASOS PCT: 1 %
Basophils Absolute: 0.1 10*3/uL (ref 0.0–0.1)
EOS ABS: 0.1 10*3/uL (ref 0.0–0.7)
EOS PCT: 1 %
HCT: 28.6 % — ABNORMAL LOW (ref 36.0–46.0)
Hemoglobin: 8.4 g/dL — ABNORMAL LOW (ref 12.0–15.0)
LYMPHS ABS: 3 10*3/uL (ref 0.7–4.0)
Lymphocytes Relative: 28 %
MCH: 21.2 pg — AB (ref 26.0–34.0)
MCHC: 29.4 g/dL — AB (ref 30.0–36.0)
MCV: 72 fL — AB (ref 78.0–100.0)
MONO ABS: 0.5 10*3/uL (ref 0.1–1.0)
Monocytes Relative: 5 %
NEUTROS ABS: 7 10*3/uL (ref 1.7–7.7)
Neutrophils Relative %: 65 %
PLATELETS: 333 10*3/uL (ref 150–400)
RBC: 3.97 MIL/uL (ref 3.87–5.11)
RDW: 18 % — AB (ref 11.5–15.5)
WBC: 10.7 10*3/uL — ABNORMAL HIGH (ref 4.0–10.5)

## 2016-06-06 LAB — COMPREHENSIVE METABOLIC PANEL
ALT: 12 U/L — AB (ref 14–54)
ANION GAP: 7 (ref 5–15)
AST: 19 U/L (ref 15–41)
Albumin: 3.7 g/dL (ref 3.5–5.0)
Alkaline Phosphatase: 44 U/L (ref 38–126)
BUN: 6 mg/dL (ref 6–20)
CHLORIDE: 107 mmol/L (ref 101–111)
CO2: 23 mmol/L (ref 22–32)
CREATININE: 0.77 mg/dL (ref 0.44–1.00)
Calcium: 9.3 mg/dL (ref 8.9–10.3)
Glucose, Bld: 102 mg/dL — ABNORMAL HIGH (ref 65–99)
POTASSIUM: 3.6 mmol/L (ref 3.5–5.1)
SODIUM: 137 mmol/L (ref 135–145)
Total Bilirubin: 0.1 mg/dL — ABNORMAL LOW (ref 0.3–1.2)
Total Protein: 7.5 g/dL (ref 6.5–8.1)

## 2016-06-06 NOTE — ED Notes (Signed)
Pt st's she was having chills 3 days ago with body aches and productive cough.  Pt st's chills have subsided but continues to have productive cough until she vomits

## 2016-06-07 NOTE — ED Notes (Signed)
Called x's 3 without response 

## 2016-06-07 NOTE — ED Notes (Signed)
Pt was called to be moved into a room at 0004, no response.

## 2016-06-07 NOTE — ED Provider Notes (Signed)
Patient eloped from the emergency department before provider evaluation. I did not see or evaluate this patient. Triage note states Pt with chills x 3 days with body aches and productive cough.  Pt was never placed in a room.     Dahlia ClientHannah Gyan Cambre, PA-C 06/07/16 0539  Melene Planan Floyd, DO 06/07/16 2333

## 2016-08-01 ENCOUNTER — Emergency Department (HOSPITAL_COMMUNITY)
Admission: EM | Admit: 2016-08-01 | Discharge: 2016-08-01 | Disposition: A | Discharge status: Home / Self Care | Payer: Self-pay | Attending: Emergency Medicine | Admitting: Emergency Medicine

## 2016-08-01 ENCOUNTER — Emergency Department (HOSPITAL_COMMUNITY): Payer: Self-pay

## 2016-08-01 ENCOUNTER — Encounter (HOSPITAL_COMMUNITY): Payer: Self-pay | Admitting: Emergency Medicine

## 2016-08-01 DIAGNOSIS — N390 Urinary tract infection, site not specified: Secondary | ICD-10-CM | POA: Insufficient documentation

## 2016-08-01 DIAGNOSIS — K2970 Gastritis, unspecified, without bleeding: Secondary | ICD-10-CM | POA: Insufficient documentation

## 2016-08-01 DIAGNOSIS — J45909 Unspecified asthma, uncomplicated: Secondary | ICD-10-CM | POA: Insufficient documentation

## 2016-08-01 DIAGNOSIS — Z79899 Other long term (current) drug therapy: Secondary | ICD-10-CM | POA: Insufficient documentation

## 2016-08-01 DIAGNOSIS — Z791 Long term (current) use of non-steroidal anti-inflammatories (NSAID): Secondary | ICD-10-CM | POA: Insufficient documentation

## 2016-08-01 LAB — I-STAT BETA HCG BLOOD, ED (MC, WL, AP ONLY): I-stat hCG, quantitative: <5 m[IU]/mL (ref <5)

## 2016-08-01 LAB — URINALYSIS, ROUTINE W REFLEX MICROSCOPIC
BILIRUBIN URINE: NEGATIVE
Glucose, UA: NEGATIVE mg/dL
Hgb urine dipstick: NEGATIVE
Ketones, ur: NEGATIVE mg/dL
NITRITE: NEGATIVE
PH: 5.5 (ref 5.0–8.0)
Protein, ur: NEGATIVE mg/dL
SPECIFIC GRAVITY, URINE: 1.031 — AB (ref 1.005–1.030)

## 2016-08-01 LAB — CBC
HEMATOCRIT: 31.6 % — AB (ref 36.0–46.0)
HEMOGLOBIN: 9.5 g/dL — AB (ref 12.0–15.0)
MCH: 21.4 pg — AB (ref 26.0–34.0)
MCHC: 30.1 g/dL (ref 30.0–36.0)
MCV: 71.3 fL — ABNORMAL LOW (ref 78.0–100.0)
Platelets: 303 10*3/uL (ref 150–400)
RBC: 4.43 MIL/uL (ref 3.87–5.11)
RDW: 18.2 % — ABNORMAL HIGH (ref 11.5–15.5)
WBC: 13.4 10*3/uL — AB (ref 4.0–10.5)

## 2016-08-01 LAB — WET PREP, GENITAL
Clue Cells Wet Prep HPF POC: NONE SEEN
Sperm: NONE SEEN
Trich, Wet Prep: NONE SEEN
Yeast Wet Prep HPF POC: NONE SEEN

## 2016-08-01 LAB — COMPREHENSIVE METABOLIC PANEL
ALT: 12 U/L — ABNORMAL LOW (ref 14–54)
ANION GAP: 6 (ref 5–15)
AST: 16 U/L (ref 15–41)
Albumin: 4.3 g/dL (ref 3.5–5.0)
Alkaline Phosphatase: 46 U/L (ref 38–126)
BILIRUBIN TOTAL: 0.4 mg/dL (ref 0.3–1.2)
BUN: 9 mg/dL (ref 6–20)
CALCIUM: 9.4 mg/dL (ref 8.9–10.3)
CO2: 25 mmol/L (ref 22–32)
Chloride: 106 mmol/L (ref 101–111)
Creatinine, Ser: 0.66 mg/dL (ref 0.44–1.00)
Glucose, Bld: 96 mg/dL (ref 65–99)
POTASSIUM: 3.6 mmol/L (ref 3.5–5.1)
Sodium: 137 mmol/L (ref 135–145)
TOTAL PROTEIN: 8.6 g/dL — AB (ref 6.5–8.1)

## 2016-08-01 LAB — LIPASE, BLOOD: Lipase: 33 U/L (ref 11–51)

## 2016-08-01 LAB — URINE MICROSCOPIC-ADD ON

## 2016-08-01 MED ORDER — ONDANSETRON 4 MG PO TBDP
4.0000 mg | ORAL_TABLET | Freq: Once | ORAL | Status: AC | PRN
  Administered 2016-08-01: 4 mg via ORAL
  Filled 2016-08-01: qty 1

## 2016-08-01 MED ORDER — CEPHALEXIN 500 MG PO CAPS: 500.0000 mg | ORAL_CAPSULE | Freq: Two times a day (BID) | ORAL | 0 refills | Status: DC

## 2016-08-01 MED ORDER — MORPHINE SULFATE (PF) 2 MG/ML IV SOLN
2.0000 mg | Freq: Once | INTRAVENOUS | Status: AC
  Administered 2016-08-01: 2 mg via INTRAVENOUS
  Filled 2016-08-01: qty 1

## 2016-08-01 MED ORDER — ONDANSETRON HCL 4 MG PO TABS: 4.0000 mg | ORAL_TABLET | Freq: Four times a day (QID) | ORAL | 0 refills | Status: DC

## 2016-08-01 MED ORDER — SODIUM CHLORIDE 0.9 % IV BOLUS (SEPSIS)
1000.0000 mL | Freq: Once | INTRAVENOUS | Status: AC
  Administered 2016-08-01: 1000 mL via INTRAVENOUS

## 2016-08-01 MED ORDER — FENTANYL CITRATE (PF) 100 MCG/2ML IJ SOLN
50.0000 ug | INTRAMUSCULAR | Status: DC | PRN
  Administered 2016-08-01: 50 ug via NASAL
  Filled 2016-08-01: qty 2

## 2016-08-01 NOTE — ED Provider Notes (Signed)
[facility]-EMERGENCY DEPT Provider Note   CSN: [id number] Arrival date & time: 08/01/16  1348  First Provider Contact:  First MD Initiated Contact with Patient 08/01/16 1830        History   Chief Complaint Chief Complaint  Patient presents with  . Abdominal Pain    HPI [name] [name] is a 35 y.o. female.  HPI   35 year old female presents today with complaints of abdominal pain. Patient reports symptoms started at approximately 9:30 this morning with sharp mid upper abdominal pain. She reports the pain initially was coming and going, but has become more frequent with longer durations. Patient reports she had an episode of vomiting prior to arrival, denies any diarrhea, normal bowel movement. She denies any fever, trauma to the abdomen. She denies any exposure to abnormal food or drink. No recent use of Tylenol ibuprofen, drugs or alcohol. No History of same.  Patient does note that her urine has been darker than normal, and has had some vaginal discharge.     Past Medical History:  Diagnosis Date  . Anxiety   . Asthma   . Hemorrhoids     Patient Active Problem List   Diagnosis Date Noted  . Pyelonephritis 05/18/2013    Past Surgical History:  Procedure Laterality Date  . TUBAL LIGATION    . TUBAL LIGATION  2006    OB History    Gravida Para Term Preterm AB Living   0 0 0 0 0     SAB TAB Ectopic Multiple Live Births   0 0 0           Home Medications    Prior to Admission medications   Medication Sig Start Date End Date Taking? Authorizing Provider  albuterol (PROVENTIL HFA;VENTOLIN HFA) 108 (90 Base) MCG/ACT inhaler Inhale 2 puffs into the lungs every 4 (four) hours as needed for wheezing or shortness of breath. 01/16/16  Yes Kayla Rose, PA-C  LORazepam (ATIVAN) 0.5 MG tablet Take 0.5 mg by mouth every 8 (eight) hours as needed for anxiety.    Yes Historical Provider, MD  cephALEXin (KEFLEX) 500 MG capsule Take 1 capsule (500 mg total) by mouth 2 (two) times daily.  08/01/16   Eyvonne Mechanic, PA-C  ibuprofen (ADVIL,MOTRIN) 800 MG tablet Take 1 tablet (800 mg total) by mouth 3 (three) times daily. Patient not taking: Reported on 08/01/2016 03/31/16   Mady Gemma, PA-C  ondansetron (ZOFRAN) 4 MG tablet Take 1 tablet (4 mg total) by mouth every 6 (six) hours. 08/01/16   Eyvonne Mechanic, PA-C    Family History Family History  Problem Relation Age of Onset  . Cancer Father     Social History Social History  Substance Use Topics  . Smoking status: Never Smoker  . Smokeless tobacco: Never Used  . Alcohol use No     Allergies   Review of patient's allergies indicates no known allergies.   Review of Systems Review of Systems  All other systems reviewed and are negative.   Physical Exam Updated Vital Signs BP 110/79 (BP Location: Left Arm)   Pulse 76   Temp 98.8 F (37.1 C) (Oral)   Resp 18   LMP 07/12/2016   SpO2 100%   Physical Exam  Constitutional: She is oriented to person, place, and time. She appears well-developed and well-nourished.  HENT:  Head: Normocephalic and atraumatic.  Eyes: Conjunctivae are normal. Pupils are equal, round, and reactive to light. Right eye exhibits no discharge. Left eye exhibits no discharge.  No scleral icterus.  Neck: Normal range of motion. No JVD present. No tracheal deviation present.  Pulmonary/Chest: Effort normal. No stridor.  Abdominal: She exhibits no distension and no mass. There is tenderness. There is no rebound and no guarding. No hernia.  RUQ TTP  Genitourinary:  Genitourinary Comments: No significant discharge noted in the vaginal vault, no cervical motion tenderness, no adnexal tenderness  Neurological: She is alert and oriented to person, place, and time. Coordination normal.  Psychiatric: She has a normal mood and affect. Her behavior is normal. Judgment and thought content normal.  Nursing note and vitals reviewed.    ED Treatments / Results  Labs (all labs ordered are  listed, but only abnormal results are displayed) Labs Reviewed  WET PREP, GENITAL - Abnormal; Notable for the following:       Result Value   WBC, Wet Prep HPF POC MODERATE (*)    All other components within normal limits  COMPREHENSIVE METABOLIC PANEL - Abnormal; Notable for the following:    Total Protein 8.6 (*)    ALT 12 (*)    All other components within normal limits  CBC - Abnormal; Notable for the following:    WBC 13.4 (*)    Hemoglobin 9.5 (*)    HCT 31.6 (*)    MCV 71.3 (*)    MCH 21.4 (*)    RDW 18.2 (*)    All other components within normal limits  URINALYSIS, ROUTINE W REFLEX MICROSCOPIC (NOT AT [facility]) - Abnormal; Notable for the following:    APPearance CLOUDY (*)    Specific Gravity, Urine 1.031 (*)    Leukocytes, UA MODERATE (*)    All other components within normal limits  URINE MICROSCOPIC-ADD ON - Abnormal; Notable for the following:    Squamous Epithelial / LPF 6-30 (*)    Bacteria, UA FEW (*)    All other components within normal limits  LIPASE, BLOOD  I-STAT BETA HCG BLOOD, ED ([facility], [facility], [facility] ONLY)  GC/CHLAMYDIA PROBE AMP ([place]) NOT AT [facility]    EKG  EKG Interpretation None       Radiology US Abdomen Limited  Result Date: 08/01/2016 CLINICAL DATA:  Right upper quadrant abdominal pain today. EXAM: US ABDOMEN LIMITED - RIGHT UPPER QUADRANT COMPARISON:  CT abdomen and pelvis 08/12/2012. FINDINGS: Gallbladder: No gallstones or wall thickening visualized. No sonographic Murphy sign noted by sonographer. Common bile duct: Diameter: 0.3 cm Liver: No focal lesion identified. Within normal limits in parenchymal echogenicity. IMPRESSION: Negative for gallstones.  Negative exam. Electronically Signed   By: Drusilla Kanner M.D.   On: 08/01/2016 19:56    Procedures Procedures (including critical care time)  Medications Ordered in ED Medications  ondansetron (ZOFRAN-ODT) disintegrating tablet 4 mg (4 mg Oral Given 08/01/16 1435)  sodium chloride 0.9 % bolus  1,000 mL (0 mLs Intravenous Stopped 08/01/16 2059)  morphine 2 MG/ML injection 2 mg (2 mg Intravenous Given 08/01/16 2005)     Initial Impression / Assessment and Plan / ED Course  I have reviewed the triage vital signs and the nursing notes.  Pertinent labs & imaging results that were available during my care of the patient were reviewed by me and considered in my medical decision making (see chart for details).  Clinical Course    Final Clinical Impressions(s) / ED Diagnoses   Final diagnoses:  Gastritis  UTI (lower urinary tract infection)   Labs: I-STAT beta-hCG, lipase, CMP, CBC, urinalysis  Imaging: Ultrasound abdomen limited  Consults:  Therapeutics: Zofran, normal saline, morphine, fentanyl  Discharge Meds:   Assessment/Plan:Patient's presentation most consistent with gastritis. She did have some right upper quadrant tenderness, no findings on ultrasound. Patient's symptoms improved during the ED. Patient had no lower abdominal pain. Pelvic exam showed no significant discharge, no cervical motion tenderness. She was noted to have UTI urine ED. Patient be treated with antibiotics, close PRIMARY care, return precautions given. She verbalized understanding and agreement today's plan     New Prescriptions Discharge Medication List as of 08/01/2016  9:28 PM    START taking these medications   Details  ondansetron (ZOFRAN) 4 MG tablet Take 1 tablet (4 mg total) by mouth every 6 (six) hours., Starting Thu 08/01/2016, Print    cephALEXin (KEFLEX) 500 MG capsule Take 1 capsule (500 mg total) by mouth 2 (two) times daily., Starting Thu 08/01/2016, Print         Eyvonne MechanicJeffrey Kamaya Keckler, PA-C 08/02/16 1614    Azalia BilisKevin Campos, MD 08/08/16 1037

## 2016-08-01 NOTE — ED Notes (Signed)
Patient given ginger ale, ok per provider.

## 2016-08-01 NOTE — ED Notes (Signed)
US at bedside

## 2016-08-01 NOTE — ED Triage Notes (Signed)
Patient reports mid center abdominal pain x1 day with 5 episodes of emesis. Reports pain "messes with anxiety and asthma". Denies fever, diarrhea, SOB. Speaking in complete sentences, respirations even and unlabored.

## 2016-08-01 NOTE — Discharge Instructions (Signed)
Please use antinausea medication as needed, please return to the emergency room if expressed any new or worsening signs or symptoms. Please follow-up with primary care provider for reevaluation of symptoms persist.

## 2016-08-02 LAB — GC/CHLAMYDIA PROBE AMP (~~LOC~~) NOT AT ARMC
Chlamydia: NEGATIVE
Neisseria Gonorrhea: NEGATIVE

## 2016-08-03 LAB — URINE CULTURE

## 2017-04-05 ENCOUNTER — Emergency Department (HOSPITAL_COMMUNITY)
Admission: EM | Admit: 2017-04-05 | Discharge: 2017-04-05 | Disposition: A | Discharge status: Home / Self Care | Payer: Self-pay | Attending: Emergency Medicine | Admitting: Emergency Medicine

## 2017-04-05 DIAGNOSIS — Y939 Activity, unspecified: Secondary | ICD-10-CM | POA: Insufficient documentation

## 2017-04-05 DIAGNOSIS — Y929 Unspecified place or not applicable: Secondary | ICD-10-CM | POA: Insufficient documentation

## 2017-04-05 DIAGNOSIS — Y999 Unspecified external cause status: Secondary | ICD-10-CM | POA: Insufficient documentation

## 2017-04-05 DIAGNOSIS — S61309A Unspecified open wound of unspecified finger with damage to nail, initial encounter: Secondary | ICD-10-CM

## 2017-04-05 DIAGNOSIS — J45909 Unspecified asthma, uncomplicated: Secondary | ICD-10-CM | POA: Insufficient documentation

## 2017-04-05 DIAGNOSIS — Z79899 Other long term (current) drug therapy: Secondary | ICD-10-CM | POA: Insufficient documentation

## 2017-04-05 DIAGNOSIS — W230XXA Caught, crushed, jammed, or pinched between moving objects, initial encounter: Secondary | ICD-10-CM | POA: Insufficient documentation

## 2017-04-05 DIAGNOSIS — S61302A Unspecified open wound of right middle finger with damage to nail, initial encounter: Secondary | ICD-10-CM | POA: Insufficient documentation

## 2017-04-05 NOTE — ED Provider Notes (Signed)
[facility]-EMERGENCY DEPT Provider Note   CSN: [id number] Arrival date & time: 04/05/17  0631     History   Chief Complaint Chief Complaint  Patient presents with  . Hand Pain    2nd finger right hand    HPI Nyeema Want is a 36 y.o. female.  HPI Norinne Jeane is a 36 y.o. female presents to emergency department complaining of a nail injury. Patient states that she was moving furniture yesterday and states that her right middle acrylic finger nail got caught and bent backwards. Patient states that it cracked in the middle and had some bleeding. States this morning pain is unbearable. Denies any other injuries. Did not take anything for pain.   Past Medical History:  Diagnosis Date  . Anxiety   . Asthma   . Hemorrhoids     Patient Active Problem List   Diagnosis Date Noted  . Pyelonephritis 05/18/2013    Past Surgical History:  Procedure Laterality Date  . TUBAL LIGATION    . TUBAL LIGATION  2006    OB History    Gravida Para Term Preterm AB Living   0 0 0 0 0     SAB TAB Ectopic Multiple Live Births   0 0 0           Home Medications    Prior to Admission medications   Medication Sig Start Date End Date Taking? Authorizing Provider  albuterol (PROVENTIL HFA;VENTOLIN HFA) 108 (90 Base) MCG/ACT inhaler Inhale 2 puffs into the lungs every 4 (four) hours as needed for wheezing or shortness of breath. 01/16/16   Cheri Fowler, PA-C  cephALEXin (KEFLEX) 500 MG capsule Take 1 capsule (500 mg total) by mouth 2 (two) times daily. 08/01/16   Eyvonne Mechanic, PA-C  ibuprofen (ADVIL,MOTRIN) 800 MG tablet Take 1 tablet (800 mg total) by mouth 3 (three) times daily. Patient not taking: Reported on 08/01/2016 03/31/16   Mady Gemma, PA-C  LORazepam (ATIVAN) 0.5 MG tablet Take 0.5 mg by mouth every 8 (eight) hours as needed for anxiety.     Historical Provider, MD  ondansetron (ZOFRAN) 4 MG tablet Take 1 tablet (4 mg total) by mouth every 6 (six) hours. 08/01/16   Eyvonne Mechanic, PA-C     Family History Family History  Problem Relation Age of Onset  . Cancer Father     Social History Social History  Substance Use Topics  . Smoking status: Never Smoker  . Smokeless tobacco: Never Used  . Alcohol use No     Allergies   Patient has no known allergies.   Review of Systems Review of Systems  Constitutional: Negative for chills and fever.  Respiratory: Negative for cough, chest tightness and shortness of breath.   Cardiovascular: Negative for chest pain, palpitations and leg swelling.  Gastrointestinal: Negative for abdominal pain and vomiting.  Musculoskeletal: Positive for arthralgias. Negative for myalgias, neck pain and neck stiffness.  Skin: Negative for rash.  Neurological: Negative for dizziness, weakness, numbness and headaches.  All other systems reviewed and are negative.    Physical Exam Updated Vital Signs BP 114/77   Pulse 100   Temp 98.5 F (36.9 C) (Oral)   Resp 15   Ht  (1.676 m)   Wt 60.8 kg   LMP 03/04/2017 (LMP Unknown)   SpO2 100%   BMI 21.63 kg/m   Physical Exam  Constitutional: She appears well-developed and well-nourished. No distress.  Eyes: Conjunctivae are normal.  Neck: Neck supple.  Musculoskeletal:  Crack in the middle of the right acrylic finger nail of right hand, distal nail is intact but loose. No bleeding.   Neurological: She is alert.  Skin: Skin is warm and dry.  Nursing note and vitals reviewed.    ED Treatments / Results  Labs (all labs ordered are listed, but only abnormal results are displayed) Labs Reviewed - No data to display  EKG  EKG Interpretation None       Radiology No results found.  Procedures Procedures (including critical care time)  Medications Ordered in ED Medications - No data to display   Initial Impression / Assessment and Plan / ED Course  I have reviewed the triage vital signs and the nursing notes.  Pertinent labs & imaging results that were available  during my care of the patient were reviewed by me and considered in my medical decision making (see chart for details).     Pt with fractured finger nail through acrylic nail. Question underlying natural fingernail fracture. No bleeding at this time. Finger looks normal otherwise. Reinforced cracked fingernail with dermabond. Advised tylenol/motrin, tape fingernail, either remove nail or trim down. follow up as needed.   Vitals:   04/05/17 0638  BP: 114/77  Pulse: 100  Resp: 15  Temp: 98.5 F (36.9 C)  TempSrc: Oral  SpO2: 100%  Weight: 60.8 kg  Height:  (1.676 m)     Final Clinical Impressions(s) / ED Diagnoses   Final diagnoses:  Partial avulsion of fingernail, initial encounter    New Prescriptions New Prescriptions   No medications on file     Jaynie Crumble, PA-C 04/05/17 0725    Pricilla Loveless, MD 04/05/17 [phone]

## 2017-04-05 NOTE — Discharge Instructions (Signed)
You can either try to remove the acrylic nail or trim shorter. Keep taped. Follow up as needed. Take tylenol or motrin for pain.

## 2017-04-05 NOTE — ED Triage Notes (Signed)
Pt states yesterday she was moving some furniture and she bent over her artificial nail on 2nd finger right hand. c/o 7/10 pain.

## 2017-05-04 ENCOUNTER — Emergency Department (HOSPITAL_COMMUNITY): Payer: Self-pay

## 2017-05-04 ENCOUNTER — Emergency Department (HOSPITAL_COMMUNITY)
Admission: EM | Admit: 2017-05-04 | Discharge: 2017-05-04 | Disposition: A | Discharge status: Home / Self Care | Payer: Self-pay | Attending: Emergency Medicine | Admitting: Emergency Medicine

## 2017-05-04 ENCOUNTER — Encounter (HOSPITAL_COMMUNITY): Payer: Self-pay

## 2017-05-04 DIAGNOSIS — Z79899 Other long term (current) drug therapy: Secondary | ICD-10-CM | POA: Insufficient documentation

## 2017-05-04 DIAGNOSIS — J45909 Unspecified asthma, uncomplicated: Secondary | ICD-10-CM | POA: Insufficient documentation

## 2017-05-04 DIAGNOSIS — R1013 Epigastric pain: Secondary | ICD-10-CM | POA: Insufficient documentation

## 2017-05-04 DIAGNOSIS — D649 Anemia, unspecified: Secondary | ICD-10-CM | POA: Insufficient documentation

## 2017-05-04 LAB — URINALYSIS, ROUTINE W REFLEX MICROSCOPIC
Bacteria, UA: NONE SEEN
Bilirubin Urine: NEGATIVE
GLUCOSE, UA: NEGATIVE mg/dL
KETONES UR: NEGATIVE mg/dL
Nitrite: NEGATIVE
Protein, ur: NEGATIVE mg/dL
Specific Gravity, Urine: 1.023 (ref 1.005–1.030)
pH: 5 (ref 5.0–8.0)

## 2017-05-04 LAB — COMPREHENSIVE METABOLIC PANEL
ALT: 10 U/L — ABNORMAL LOW (ref 14–54)
AST: 15 U/L (ref 15–41)
Albumin: 3.6 g/dL (ref 3.5–5.0)
Alkaline Phosphatase: 41 U/L (ref 38–126)
Anion gap: 5 (ref 5–15)
BUN: 5 mg/dL — ABNORMAL LOW (ref 6–20)
CHLORIDE: 107 mmol/L (ref 101–111)
CO2: 24 mmol/L (ref 22–32)
Calcium: 8.7 mg/dL — ABNORMAL LOW (ref 8.9–10.3)
Creatinine, Ser: 0.75 mg/dL (ref 0.44–1.00)
Glucose, Bld: 100 mg/dL — ABNORMAL HIGH (ref 65–99)
POTASSIUM: 3.4 mmol/L — AB (ref 3.5–5.1)
SODIUM: 136 mmol/L (ref 135–145)
Total Bilirubin: <0.1 mg/dL — ABNORMAL LOW (ref 0.3–1.2)
Total Protein: 7.5 g/dL (ref 6.5–8.1)

## 2017-05-04 LAB — CBC
HCT: 26.7 % — ABNORMAL LOW (ref 36.0–46.0)
Hemoglobin: 7.8 g/dL — ABNORMAL LOW (ref 12.0–15.0)
MCH: 19.9 pg — ABNORMAL LOW (ref 26.0–34.0)
MCHC: 29.2 g/dL — ABNORMAL LOW (ref 30.0–36.0)
MCV: 68.1 fL — AB (ref 78.0–100.0)
Platelets: 339 10*3/uL (ref 150–400)
RBC: 3.92 MIL/uL (ref 3.87–5.11)
RDW: 19.7 % — ABNORMAL HIGH (ref 11.5–15.5)
WBC: 8.4 10*3/uL (ref 4.0–10.5)

## 2017-05-04 LAB — I-STAT BETA HCG BLOOD, ED (MC, WL, AP ONLY)

## 2017-05-04 LAB — POC OCCULT BLOOD, ED: FECAL OCCULT BLD: NEGATIVE

## 2017-05-04 LAB — LIPASE, BLOOD: LIPASE: 42 U/L (ref 11–51)

## 2017-05-04 MED ORDER — MORPHINE SULFATE (PF) 4 MG/ML IV SOLN
4.0000 mg | Freq: Once | INTRAVENOUS | Status: AC
  Administered 2017-05-04: 4 mg via INTRAVENOUS
  Filled 2017-05-04: qty 1

## 2017-05-04 MED ORDER — FAMOTIDINE IN NACL 20-0.9 MG/50ML-% IV SOLN
20.0000 mg | Freq: Once | INTRAVENOUS | Status: AC
  Administered 2017-05-04: 20 mg via INTRAVENOUS
  Filled 2017-05-04: qty 50

## 2017-05-04 MED ORDER — IOPAMIDOL (ISOVUE-300) INJECTION 61%
INTRAVENOUS | Status: AC
  Administered 2017-05-04: 100 mL
  Filled 2017-05-04: qty 100

## 2017-05-04 MED ORDER — OMEPRAZOLE MAGNESIUM 20 MG PO TBEC: 20.0000 mg | DELAYED_RELEASE_TABLET | Freq: Every day | ORAL | 1 refills | Status: DC

## 2017-05-04 MED ORDER — SODIUM CHLORIDE 0.9 % IV BOLUS (SEPSIS)
1000.0000 mL | Freq: Once | INTRAVENOUS | Status: AC
  Administered 2017-05-04: 1000 mL via INTRAVENOUS

## 2017-05-04 MED ORDER — ONDANSETRON HCL 4 MG/2ML IJ SOLN
4.0000 mg | Freq: Once | INTRAMUSCULAR | Status: AC
  Administered 2017-05-04: 4 mg via INTRAVENOUS
  Filled 2017-05-04: qty 2

## 2017-05-04 NOTE — ED Triage Notes (Signed)
Patient complains of epigastric pain with nausea and vomiting since Thursday. States that the pain starts as a cramping. No vomiting on arrival, denies diarrhea

## 2017-05-04 NOTE — ED Notes (Signed)
Patient transported to X-ray 

## 2017-05-04 NOTE — ED Provider Notes (Signed)
[facility] Provider Note   CSN: [id number] Arrival date & time: 05/04/17  0844     History   Chief Complaint Chief Complaint  Patient presents with  . Abdominal Pain  . Emesis    HPI [name] [name] is a 36 y.o. female.  The history is provided by the patient.  Abdominal Pain   This is a new problem. The current episode started more than 2 days ago. The problem occurs constantly. The problem has been gradually worsening. The pain is associated with eating. The pain is located in the epigastric region. The pain is moderate. Associated symptoms include vomiting. The symptoms are aggravated by eating. Nothing relieves the symptoms. Her past medical history is significant for GERD. Her past medical history does not include gallstones.  Emesis   Associated symptoms include abdominal pain.    Past Medical History:  Diagnosis Date  . Anxiety   . Asthma   . Hemorrhoids     Patient Active Problem List   Diagnosis Date Noted  . Pyelonephritis 05/18/2013    Past Surgical History:  Procedure Laterality Date  . TUBAL LIGATION    . TUBAL LIGATION  2006    OB History    Gravida Para Term Preterm AB Living   0 0 0 0 0     SAB TAB Ectopic Multiple Live Births   0 0 0           Home Medications    Prior to Admission medications   Medication Sig Start Date End Date Taking? Authorizing Provider  albuterol (PROVENTIL HFA;VENTOLIN HFA) 108 (90 Base) MCG/ACT inhaler Inhale 2 puffs into the lungs every 4 (four) hours as needed for wheezing or shortness of breath. Patient not taking: Reported on 05/04/2017 01/16/16   Cheri Fowler, PA-C  cephALEXin (KEFLEX) 500 MG capsule Take 1 capsule (500 mg total) by mouth 2 (two) times daily. 08/01/16   Hedges, Tinnie Gens, PA-C  ibuprofen (ADVIL,MOTRIN) 800 MG tablet Take 1 tablet (800 mg total) by mouth 3 (three) times daily. Patient not taking: Reported on 08/01/2016 03/31/16   Mady Gemma, PA-C  LORazepam (ATIVAN) 0.5 MG tablet Take  0.5 mg by mouth every 8 (eight) hours as needed for anxiety.     [provider]  omeprazole (PRILOSEC OTC) 20 MG tablet Take 1 tablet (20 mg total) by mouth daily. 05/04/17   Linwood Dibbles, MD  ondansetron (ZOFRAN) 4 MG tablet Take 1 tablet (4 mg total) by mouth every 6 (six) hours. 08/01/16   Eyvonne Mechanic, PA-C    Family History Family History  Problem Relation Age of Onset  . Cancer Father     Social History Social History  Substance Use Topics  . Smoking status: Never Smoker  . Smokeless tobacco: Never Used  . Alcohol use No     Allergies   Patient has no known allergies.   Review of Systems Review of Systems  Gastrointestinal: Positive for abdominal pain, anal bleeding (occsnl rectal bleeding, none recently, attributes to hemorrhoids) and vomiting.  All other systems reviewed and are negative.    Physical Exam Updated Vital Signs BP 94/68   Pulse 81   Temp 99.2 F (37.3 C) (Oral)   Resp 18   LMP 03/04/2017 (LMP Unknown)   SpO2 100%   Physical Exam  Constitutional: She appears well-developed and well-nourished. No distress.  HENT:  Head: Normocephalic and atraumatic.  Right Ear: External ear normal.  Left Ear: External ear normal.  Eyes: Conjunctivae are normal.  Right eye exhibits no discharge. Left eye exhibits no discharge. No scleral icterus.  Neck: Neck supple. No tracheal deviation present.  Cardiovascular: Normal rate, regular rhythm and intact distal pulses.   Pulmonary/Chest: Effort normal and breath sounds normal. No stridor. No respiratory distress. She has no wheezes. She has no rales.  Abdominal: Soft. Bowel sounds are normal. She exhibits no distension. There is tenderness in the epigastric area. There is no rebound and no guarding.  Genitourinary: Rectal exam shows external hemorrhoid.  Genitourinary Comments: No rectal bleeding noted  Musculoskeletal: She exhibits no edema or tenderness.  Neurological: She is alert. She has normal  strength. No cranial nerve deficit (no facial droop, extraocular movements intact, no slurred speech) or sensory deficit. She exhibits normal muscle tone. She displays no seizure activity. Coordination normal.  Skin: Skin is warm and dry. No rash noted.  Psychiatric: She has a normal mood and affect.  Nursing note and vitals reviewed.    ED Treatments / Results  Labs (all labs ordered are listed, but only abnormal results are displayed) Labs Reviewed  COMPREHENSIVE METABOLIC PANEL - Abnormal; Notable for the following:       Result Value   Potassium 3.4 (*)    Glucose, Bld 100 (*)    BUN 5 (*)    Calcium 8.7 (*)    ALT 10 (*)    Total Bilirubin <0.1 (*)    All other components within normal limits  CBC - Abnormal; Notable for the following:    Hemoglobin 7.8 (*)    HCT 26.7 (*)    MCV 68.1 (*)    MCH 19.9 (*)    MCHC 29.2 (*)    RDW 19.7 (*)    All other components within normal limits  URINALYSIS, ROUTINE W REFLEX MICROSCOPIC - Abnormal; Notable for the following:    APPearance HAZY (*)    Hgb urine dipstick LARGE (*)    Leukocytes, UA MODERATE (*)    Squamous Epithelial / LPF 6-30 (*)    All other components within normal limits  LIPASE, BLOOD  I-STAT BETA HCG BLOOD, ED ([facility], [facility], [facility] ONLY)  POC OCCULT BLOOD, ED    EKG  EKG Interpretation None       Radiology Ct Abdomen Pelvis W Contrast  Result Date: 05/04/2017 CLINICAL DATA:  Abdominal pain, nausea and vomiting for several days. EXAM: CT ABDOMEN AND PELVIS WITH CONTRAST TECHNIQUE: Multidetector CT imaging of the abdomen and pelvis was performed using the standard protocol following bolus administration of intravenous contrast. CONTRAST:  ISOVUE-300 IOPAMIDOL (ISOVUE-300) INJECTION 61% COMPARISON:  08/12/2012 CT abdomen/ pelvis. 08/01/2016 right upper quadrant abdominal sonogram. FINDINGS: Lower chest: No significant pulmonary nodules or acute consolidative airspace disease. Hepatobiliary: Normal liver size and  configuration. Low-attenuation subcapsular subcentimeter focus in the anterior left lower lobe (series 3/ image 13) is too small to characterize and is favored to represent a benign transient perfusional phenomenon, requiring no further evaluation unless the patient has risk factors for liver malignancy. Otherwise no focal liver lesions. Normal gallbladder with no radiopaque cholelithiasis. No biliary ductal dilatation. Pancreas: Normal, with no mass or duct dilation. Spleen: Normal size. No mass. Adrenals/Urinary Tract: Normal adrenals. Normal size kidneys. Symmetric normal contrast nephrograms. No hydronephrosis. Subcentimeter hypodense renal cortical lesions in the medial upper left kidney and anterior lower right kidney are too small to characterize and require no further follow-up. Normal bladder. Stomach/Bowel: Grossly normal stomach. Normal caliber small bowel with no small bowel wall thickening. Normal appendix.  Normal large bowel with no diverticulosis, large bowel wall thickening or pericolonic fat stranding. Vascular/Lymphatic: Normal caliber abdominal aorta. Patent portal, splenic, hepatic and renal veins. No pathologically enlarged lymph nodes in the abdomen or pelvis. Reproductive: Gross normal anteverted uterus . Simple 2.9 cm right adnexal cyst (series 3/ image 63). No additional adnexal lesions. Other: Trace free fluid in the pelvis, probably physiologic. No focal fluid collection. No pneumoperitoneum . Musculoskeletal: No aggressive appearing focal osseous lesions. IMPRESSION: 1. No evidence of bowel obstruction or acute bowel inflammation. Normal appendix. 2. Simple 2.9 cm right adnexal cyst, for which no further follow-up is required. This recommendation follows ACR consensus guidelines: White Paper of the ACR Incidental Findings Committee II on Adnexal Findings. J Am Coll Radiol [id number]. Electronically Signed   By: Delbert PhenixJason A Poff M.D.   On: 05/04/2017 13:56    Procedures Procedures  (including critical care time)  Medications Ordered in ED Medications  sodium chloride 0.9 % bolus 1,000 mL (0 mLs Intravenous Stopped 05/04/17 1114)  morphine 4 MG/ML injection 4 mg (4 mg Intravenous Given 05/04/17 1016)  ondansetron (ZOFRAN) injection 4 mg (4 mg Intravenous Given 05/04/17 1016)  famotidine (PEPCID) IVPB 20 mg premix (0 mg Intravenous Stopped 05/04/17 1114)  iopamidol (ISOVUE-300) 61 % injection (100 mLs  Contrast Given 05/04/17 1315)  morphine 4 MG/ML injection 4 mg (4 mg Intravenous Given 05/04/17 1308)     Initial Impression / Assessment and Plan / ED Course  I have reviewed the triage vital signs and the nursing notes.  Pertinent labs & imaging results that were available during my care of the patient were reviewed by me and considered in my medical decision making (see chart for details).  Clinical Course as of May 04 1425  Sun May 04, 2017  1031 Hgb decreased from most recent value 9 months ago (9.5).  [JK]  1122 Stool is negative for blood. Still having persistent pain.  Will CT to evaluate further.  [JK]  1258 Pt is complaining of recurrent pain.  CT pending.  Additional pain meds given  [JK]    Clinical Course User Index [JK] Linwood DibblesKnapp, Anaira Seay, MD    Patient presented to the emergency room with complaints of upper abdominal pain. Laboratory tests and CT scan do not show any acute abnormalities that would account for the patient's abdominal pain. Her hemoglobin however is lower than previous values. Patient does have chronic anemia but this is worse than before. She is guaiac negative. No evidence to suggest active GI bleeding. Recommend follow-up with a GI doctor.  Warning signs and precautions discussed.  Final Clinical Impressions(s) / ED Diagnoses   Final diagnoses:  Epigastric pain  Anemia, unspecified type    New Prescriptions New Prescriptions   OMEPRAZOLE (PRILOSEC OTC) 20 MG TABLET    Take 1 tablet (20 mg total) by mouth daily.     Linwood DibblesKnapp, Darrian Goodwill,  MD 05/04/17 [phone]

## 2017-05-04 NOTE — ED Notes (Signed)
Pt departed in NAD, refused use of wheelchair.  

## 2017-05-04 NOTE — Discharge Instructions (Signed)
Follow-up with a primary care doctor to do further testing regarding your anemia, consider seeing a GI doctor regarding your upper abdominal pain, started taking the medications as prescribed

## 2017-05-04 NOTE — ED Notes (Signed)
Patient transported to CT 

## 2017-09-11 ENCOUNTER — Emergency Department (HOSPITAL_COMMUNITY)
Admission: EM | Admit: 2017-09-11 | Discharge: 2017-09-11 | Disposition: A | Discharge status: Home / Self Care | Payer: Self-pay | Attending: Emergency Medicine | Admitting: Emergency Medicine

## 2017-09-11 ENCOUNTER — Encounter (HOSPITAL_COMMUNITY): Payer: Self-pay | Admitting: Emergency Medicine

## 2017-09-11 DIAGNOSIS — Z79899 Other long term (current) drug therapy: Secondary | ICD-10-CM | POA: Insufficient documentation

## 2017-09-11 DIAGNOSIS — J45909 Unspecified asthma, uncomplicated: Secondary | ICD-10-CM | POA: Insufficient documentation

## 2017-09-11 DIAGNOSIS — L249 Irritant contact dermatitis, unspecified cause: Secondary | ICD-10-CM | POA: Insufficient documentation

## 2017-09-11 DIAGNOSIS — F419 Anxiety disorder, unspecified: Secondary | ICD-10-CM | POA: Insufficient documentation

## 2017-09-11 MED ORDER — TRIAMCINOLONE ACETONIDE 0.1 % EX CREA
1.0000 | TOPICAL_CREAM | Freq: Two times a day (BID) | CUTANEOUS | 0 refills | Status: DC
Start: 2017-09-11 — End: 2019-11-10

## 2017-09-11 NOTE — ED Provider Notes (Signed)
[facility] Provider Note   CSN: [id number] Arrival date & time: 09/11/17  1512     History   Chief Complaint Chief Complaint  Patient presents with  . Allergic Reaction    HPI [name] [name] is a 36 y.o. female.  HPI  Patient is a 36 year old female presenting for possible allergic reaction to Suave shampoo. Patient reports immediately experiencing a burning sensation in her posterior scalp after using this new shampoo. Patient reports 3 days of irritation to the postauricular and occipital region with associated scaling. Patient reports that the pain is approximately 8 out of 10 at its worst. Patient has tried Neosporin ointment over the lesions but they are not improving. Patient denies any erythema, swelling, difficulty breathing, oral lesions, or any other rashes on her body from this event.  Past Medical History:  Diagnosis Date  . Anxiety   . Asthma   . Hemorrhoids     Patient Active Problem List   Diagnosis Date Noted  . Pyelonephritis 05/18/2013    Past Surgical History:  Procedure Laterality Date  . TUBAL LIGATION    . TUBAL LIGATION  2006    OB History    Gravida Para Term Preterm AB Living   0 0 0 0 0     SAB TAB Ectopic Multiple Live Births   0 0 0           Home Medications    Prior to Admission medications   Medication Sig Start Date End Date Taking? Authorizing Provider  albuterol (PROVENTIL HFA;VENTOLIN HFA) 108 (90 Base) MCG/ACT inhaler Inhale 2 puffs into the lungs every 4 (four) hours as needed for wheezing or shortness of breath. Patient not taking: Reported on 05/04/2017 01/16/16   Cheri Fowler, PA-C  cephALEXin (KEFLEX) 500 MG capsule Take 1 capsule (500 mg total) by mouth 2 (two) times daily. 08/01/16   Hedges, Tinnie Gens, PA-C  ibuprofen (ADVIL,MOTRIN) 800 MG tablet Take 1 tablet (800 mg total) by mouth 3 (three) times daily. Patient not taking: Reported on 08/01/2016 03/31/16   Mady Gemma, PA-C  LORazepam (ATIVAN) 0.5 MG tablet  Take 0.5 mg by mouth every 8 (eight) hours as needed for anxiety.     [provider]  omeprazole (PRILOSEC OTC) 20 MG tablet Take 1 tablet (20 mg total) by mouth daily. 05/04/17   Linwood Dibbles, MD  ondansetron (ZOFRAN) 4 MG tablet Take 1 tablet (4 mg total) by mouth every 6 (six) hours. 08/01/16   Hedges, Tinnie Gens, PA-C  triamcinolone cream (KENALOG) 0.1 % Apply 1 application topically 2 (two) times daily. 09/11/17   Elisha Ponder, PA-C    Family History Family History  Problem Relation Age of Onset  . Cancer Father     Social History Social History  Substance Use Topics  . Smoking status: Never Smoker  . Smokeless tobacco: Never Used  . Alcohol use No     Allergies   Patient has no known allergies.   Review of Systems Review of Systems  HENT: Positive for ear pain. Negative for mouth sores and sore throat.   Respiratory: Negative for shortness of breath.   Gastrointestinal: Negative for abdominal pain, diarrhea, nausea and vomiting.  Skin: Positive for rash.     Physical Exam Updated Vital Signs BP 104/68   Pulse 80   Temp 98.7 F (37.1 C) (Oral)   Resp 16   Ht  (1.676 m)   Wt 59 kg (130 lb)   LMP 09/04/2017  SpO2 100%   BMI 20.98 kg/m   Physical Exam  Constitutional: She appears well-developed and well-nourished. No distress.  Sitting comfortably in bed.  HENT:  Head: Normocephalic and atraumatic.  Right Ear: Tympanic membrane and external ear normal.  Left Ear: Tympanic membrane and external ear normal.  Scaling without surrounding erythema of the posterior neck and postauricular regions bilaterally. No swelling or drainage.  Eyes: Conjunctivae are normal. Right eye exhibits no discharge. Left eye exhibits no discharge.  EOMs normal to gross examination.  Neck: Normal range of motion.  Pulmonary/Chest:  Normal respiratory effort. Patient converses comfortably. No audible wheeze or stridor.  Abdominal: She exhibits no distension.    Musculoskeletal: Normal range of motion.  Neurological: She is alert.  Cranial nerves intact to gross observation. Patient moves extremities without difficulty.  Skin: Skin is warm and dry. She is not diaphoretic.  Psychiatric: She has a normal mood and affect. Her behavior is normal. Judgment and thought content normal.  Nursing note and vitals reviewed.    ED Treatments / Results  Labs (all labs ordered are listed, but only abnormal results are displayed) Labs Reviewed - No data to display  EKG  EKG Interpretation None       Radiology No results found.  Procedures Procedures (including critical care time)  Medications Ordered in ED Medications - No data to display   Initial Impression / Assessment and Plan / ED Course  I have reviewed the triage vital signs and the nursing notes.  Pertinent labs & imaging results that were available during my care of the patient were reviewed by me and considered in my medical decision making (see chart for details).      Final Clinical Impressions(s) / ED Diagnoses   Final diagnoses:  Irritant contact dermatitis, unspecified trigger   MDM  Patient is a 36 year old female presenting for possible allergic reaction to Suave shampoo. Given the time course of symptoms after immediately starting this new shampoo, likely due to contact irritation from the shampoo. Patient exhibits no systemic signs or symptoms of allergic reaction. Patient experienced no. Tenderness or urticaria from this event. Patient is showing no signs of infection surrounding these lesions. Will treat with Kenalog cream for symptoms. Patient is in understanding and agrees with the plan of care.  New Prescriptions Discharge Medication List as of 09/11/2017  5:51 PM    START taking these medications   Details  triamcinolone cream (KENALOG) 0.1 % Apply 1 application topically 2 (two) times daily., Starting Thu 09/11/2017, Print         [place], Arlyss Repress B,  PA-C 09/11/17 2225    Alvira Monday, MD 09/12/17 [phone]

## 2017-09-11 NOTE — Discharge Instructions (Signed)
Please see the information and instructions below regarding your visit.  Your diagnoses today include:  1. Irritant contact dermatitis due to cosmetics    This is a rash that occurs after you are exposed to an irritant like the shampoo used.  Tests performed today include: See side panel of your discharge paperwork for testing performed today. Vital signs are listed at the bottom of these instructions.   Medications prescribed:    Kenalog ointment. This is a topical steroid. Please apply a film of this ointment to the affected areas twice a day for 7 days. This medication can be purchased at [facility] for $4.  Take any prescribed medications only as prescribed, and any over the counter medications only as directed on the packaging.  Home care instructions:  Please follow any educational materials contained in this packet.   Follow-up instructions: Please follow-up with a primary care provider in as needed for further evaluation of your symptoms if they are not completely improved.   Return instructions:  Please return to the Emergency Department if you experience worsening symptoms.  Please return for any worsening pain, spreading redness from these areas affected, swelling, or if this is not improving. Please return if you have any other emergent concerns.   Your vital signs today were: BP 113/74 (BP Location: Right Arm)    Pulse 88    Temp 98.7 F (37.1 C) (Oral)    Resp 18    Ht  (1.676 m)    Wt 59 kg (130 lb)    LMP 09/04/2017    SpO2 100%    BMI 20.98 kg/m  If your blood pressure (BP) was elevated on multiple readings during this visit above 130 for the top number or above 80 for the bottom number, please have this repeated by your primary care provider within one month. --------------  Thank you for allowing [place] to participate in your care today.

## 2017-09-11 NOTE — ED Triage Notes (Signed)
Pt presents to ED for assessment after using Suave shampoo on her hair three days ago.  She began to have immediate burning to her scalp.  Pt states she has noticed increase redness, burning and discoloration behind her ears.  States the company only offered to send her free coupons.  NAD at this time.

## 2019-01-24 ENCOUNTER — Emergency Department (HOSPITAL_COMMUNITY)
Admission: EM | Admit: 2019-01-24 | Discharge: 2019-01-25 | Disposition: A | Discharge status: Home / Self Care | Payer: Self-pay | Attending: Emergency Medicine | Admitting: Emergency Medicine

## 2019-01-24 ENCOUNTER — Encounter (HOSPITAL_COMMUNITY): Payer: Self-pay

## 2019-01-24 DIAGNOSIS — Z5321 Procedure and treatment not carried out due to patient leaving prior to being seen by health care provider: Secondary | ICD-10-CM | POA: Insufficient documentation

## 2019-01-24 DIAGNOSIS — R509 Fever, unspecified: Secondary | ICD-10-CM | POA: Insufficient documentation

## 2019-01-24 MED ORDER — ACETAMINOPHEN 325 MG PO TABS
650.0000 mg | ORAL_TABLET | Freq: Once | ORAL | Status: AC | PRN
  Administered 2019-01-24: 650 mg via ORAL
  Filled 2019-01-24: qty 2

## 2019-01-24 NOTE — ED Triage Notes (Signed)
Pt endorses flu like s/s starting last night at 9 pm. Pt states fevers/chills/cough and some SOB. Pt endorses generalized pain 10/10. Pt alert, oriented, and ambulatory.

## 2019-01-25 NOTE — ED Notes (Signed)
No answer for room after multiple attempts

## 2019-04-13 ENCOUNTER — Ambulatory Visit (HOSPITAL_COMMUNITY)
Admission: EM | Admit: 2019-04-13 | Discharge: 2019-04-13 | Disposition: A | Discharge status: Home / Self Care | Payer: Self-pay | Attending: Family Medicine | Admitting: Family Medicine

## 2019-04-13 ENCOUNTER — Encounter (HOSPITAL_COMMUNITY): Payer: Self-pay

## 2019-04-13 ENCOUNTER — Other Ambulatory Visit: Payer: Self-pay

## 2019-04-13 DIAGNOSIS — K2900 Acute gastritis without bleeding: Secondary | ICD-10-CM

## 2019-04-13 MED ORDER — SUCRALFATE 1 G PO TABS: 1.0000 g | ORAL_TABLET | Freq: Three times a day (TID) | ORAL | 0 refills | Status: DC

## 2019-04-13 MED ORDER — OMEPRAZOLE 20 MG PO CPDR
20.0000 mg | DELAYED_RELEASE_CAPSULE | Freq: Every day | ORAL | 0 refills | Status: DC
Start: 2019-04-13 — End: 2019-11-10

## 2019-04-13 NOTE — ED Triage Notes (Signed)
C/o lower abdominal pain, describes gastric symptoms, unable to eat. Denies vomiting , positive for diarrhea

## 2019-04-21 NOTE — ED Provider Notes (Signed)
[facility]   [id number] 04/13/19 Arrival Time: 1904  ASSESSMENT & PLAN:  1. Acute gastritis without hemorrhage, unspecified gastritis type    Meds ordered this encounter  Medications  . omeprazole (PRILOSEC) 20 MG capsule    Sig: Take 1 capsule (20 mg total) by mouth daily.    Dispense:  30 capsule    Refill:  0  . sucralfate (CARAFATE) 1 g tablet    Sig: Take 1 tablet (1 g total) by mouth 4 (four) times daily -  with meals and at bedtime.    Dispense:  28 tablet    Refill:  0   Recommend smaller, more frequent meals. Will do her best to ensure adequate fluid intake in order to avoid dehydration. Will proceed to the Emergency Department if current symptoms abruptly worsen. May f/u here if not improving over the next several days.  Reviewed expectations re: course of current medical issues. Questions answered. Outlined signs and symptoms indicating need for more acute intervention. Patient verbalized understanding. After Visit Summary given.   SUBJECTIVE: History from: patient.  Nema Aguillar is a 38 y.o. female who presents with non-radiating epigastric discomfort similar to gastritis she has had in the past. No n/v but has not been able to eat much secondary to discomfort associated with eating. Mild non-bloody diarrhea for the past 1-2 days. Not worsening. Slightly improved today. Aggravating factors: eating. Alleviating factors: none identified. Associated symptoms: mild fatigue. She denies arthralgias, chills, dysuria, fever, headache, hematuria, melena, myalgias and sweats. Appetite: decreased. Ambulatory without assistance. Urinary symptoms: none. Sick contacts: none. Recent travel or camping: none. OTC treatment: none.  Patient's last menstrual period was 04/06/2019.  Past Surgical History:  Procedure Laterality Date  . TUBAL LIGATION    . TUBAL LIGATION  2006    ROS: As per HPI. All other systems negative.   OBJECTIVE:  Vitals:   04/13/19 1918   BP: 110/71  Pulse: 97  Resp: 18  Temp: 99.2 F (37.3 C)  SpO2: 98%    General appearance: alert; no distress Oropharynx: moist Lungs: clear to auscultation bilaterally; unlabored Heart: regular rate and rhythm Abdomen: soft; non-distended; no significant abdominal tenderness; reports "burning" feeling; bowel sounds present; no masses or organomegaly; no guarding or rebound tenderness Back: no CVA tenderness Extremities: no edema; symmetrical with no gross deformities Skin: warm; dry Neurologic: normal gait Psychological: alert and cooperative; normal mood and affect  No Known Allergies                                             Past Medical History:  Diagnosis Date  . Anxiety   . Asthma   . Hemorrhoids    Social History   Socioeconomic History  . Marital status: Single    Spouse name: Not on file  . Number of children: Not on file  . Years of education: Not on file  . Highest education level: Not on file  Occupational History  . Not on file  Social Needs  . Financial resource strain: Not on file  . Food insecurity:    Worry: Not on file    Inability: Not on file  . Transportation needs:    Medical: Not on file    Non-medical: Not on file  Tobacco Use  . Smoking status: Never Smoker  . Smokeless tobacco: Never Used  Substance and Sexual Activity  .  Alcohol use: No  . Drug use: No  . Sexual activity: Yes    Birth control/protection: None  Lifestyle  . Physical activity:    Days per week: Not on file    Minutes per session: Not on file  . Stress: Not on file  Relationships  . Social connections:    Talks on phone: Not on file    Gets together: Not on file    Attends religious service: Not on file    Active member of club or organization: Not on file    Attends meetings of clubs or organizations: Not on file    Relationship status: Not on file  . Intimate partner violence:    Fear of current or ex partner: Not on file    Emotionally abused: Not on file     Physically abused: Not on file    Forced sexual activity: Not on file  Other Topics Concern  . Not on file  Social History Narrative   ** Merged History Encounter **       Family History  Problem Relation Age of Onset  . Cancer Father      Mardella LaymanHagler, Genice Kimberlin, MD 04/21/19 [phone]

## 2019-07-07 ENCOUNTER — Other Ambulatory Visit: Payer: Self-pay

## 2019-07-07 ENCOUNTER — Ambulatory Visit (HOSPITAL_COMMUNITY)
Admission: EM | Admit: 2019-07-07 | Discharge: 2019-07-07 | Disposition: A | Discharge status: Home / Self Care | Payer: Medicaid Other | Attending: Family Medicine | Admitting: Family Medicine

## 2019-07-07 ENCOUNTER — Encounter (HOSPITAL_COMMUNITY): Payer: Self-pay | Admitting: Emergency Medicine

## 2019-07-07 DIAGNOSIS — Z3202 Encounter for pregnancy test, result negative: Secondary | ICD-10-CM

## 2019-07-07 DIAGNOSIS — N946 Dysmenorrhea, unspecified: Secondary | ICD-10-CM

## 2019-07-07 LAB — POCT URINALYSIS DIP (DEVICE)
Bilirubin Urine: NEGATIVE
Glucose, UA: NEGATIVE mg/dL
Ketones, ur: NEGATIVE mg/dL
Leukocytes,Ua: NEGATIVE
Nitrite: NEGATIVE
Protein, ur: NEGATIVE mg/dL
Specific Gravity, Urine: >=1.03 (ref 1.005–1.030)
Urobilinogen, UA: 0.2 mg/dL (ref 0.0–1.0)
pH: 7 (ref 5.0–8.0)

## 2019-07-07 LAB — POCT PREGNANCY, URINE: Preg Test, Ur: NEGATIVE

## 2019-07-07 MED ORDER — IBUPROFEN 800 MG PO TABS: 800.0000 mg | ORAL_TABLET | Freq: Three times a day (TID) | ORAL | 0 refills | Status: DC

## 2019-07-07 MED ORDER — TRAMADOL HCL 50 MG PO TABS: 50.0000 mg | ORAL_TABLET | Freq: Four times a day (QID) | ORAL | 0 refills | Status: DC | PRN

## 2019-07-07 NOTE — Discharge Instructions (Addendum)
Be aware, pain medications may cause drowsiness. Please do not drive, operate heavy machinery or make important decisions while on this medication, it can cloud your judgement.  Please schedule follow up with your gynecologist.

## 2019-07-07 NOTE — ED Triage Notes (Signed)
PT reports lower abdominal pain for 2 days. Last month's menstrual was lighter than normal and this month's is as well. PT reports bloating

## 2019-07-07 NOTE — ED Provider Notes (Signed)
[facility]   [id number] 07/07/19 Arrival Time: 6962  ASSESSMENT & PLAN:  1. Severe menstrual cramps    Benign abdominal exam. No indications for urgent abdominal/pelvic imaging at this time. Discussed.  Meds ordered this encounter  Medications  . traMADol (ULTRAM) 50 MG tablet    Sig: Take 1 tablet (50 mg total) by mouth every 6 (six) hours as needed.    Dispense:  15 tablet    Refill:  0  . ibuprofen (ADVIL) 800 MG tablet    Sig: Take 1 tablet (800 mg total) by mouth 3 (three) times daily with meals.    Dispense:  21 tablet    Refill:  0     Discharge Instructions     Be aware, pain medications may cause drowsiness. Please do not drive, operate heavy machinery or make important decisions while on this medication, it can cloud your judgement.  Please schedule follow up with your gynecologist.     Reviewed expectations re: course of current medical issues. Questions answered. Outlined signs and symptoms indicating need for more acute intervention. Patient verbalized understanding. After Visit Summary given.   SUBJECTIVE: History from: patient. [name] [name] is a 38 y.o. female who presents with complaint of lower abdomen 'cramping' for several days. Patient's last menstrual period was 07/05/2019. Still with some bleeding. H/O 'bad menstrual cramping'. Periods last two months lighter than normal. Discomfort described as "cramping I have with my periods"; without radiation. Also reports "feeling bloated." Symptoms are unchanged since beginning. Fever: absent. Aggravating factors: have not been identified. Alleviating factors: have not been identified. Associated symptoms: fatigue. She denies constipation, diarrhea, fever, nausea and vomiting. Appetite: normal. PO intake: normal. Ambulatory without assistance. Urinary symptoms: none. Bowel movements: have not significantly changed; last bowel movement 1-2 d ago without blood. OTC treatment: none reported.  Past  Surgical History:  Procedure Laterality Date  . TUBAL LIGATION    . TUBAL LIGATION  2006   ROS: As per HPI. All other systems negative.  OBJECTIVE:  Vitals:   07/07/19 1632  BP: 109/76  Pulse: 85  Resp: 16  Temp: 98.4 F (36.9 C)  TempSrc: Oral  SpO2: 100%    General appearance: alert, oriented, no acute distress Lungs: clear to auscultation bilaterally; unlabored respirations Heart: regular rate and rhythm Abdomen: soft; without distention; mild tenderness over lower abdomen reported; normal bowel sounds; without masses or organomegaly; without guarding or rebound tenderness Back: without CVA tenderness; FROM at waist Extremities: without LE edema; symmetrical; without gross deformities Skin: warm and dry Neurologic: normal gait Psychological: alert and cooperative; normal mood and affect  Labs: Results for orders placed or performed during the hospital encounter of 07/07/19  POCT urinalysis dip (device)  Result Value Ref Range   Glucose, UA NEGATIVE NEGATIVE mg/dL   Bilirubin Urine NEGATIVE NEGATIVE   Ketones, ur NEGATIVE NEGATIVE mg/dL   Specific Gravity, Urine >=1.030 1.005 - 1.030   Hgb urine dipstick MODERATE (A) NEGATIVE   pH 7.0 5.0 - 8.0   Protein, ur NEGATIVE NEGATIVE mg/dL   Urobilinogen, UA 0.2 0.0 - 1.0 mg/dL   Nitrite NEGATIVE NEGATIVE   Leukocytes,Ua NEGATIVE NEGATIVE  Pregnancy, urine POC  Result Value Ref Range   Preg Test, Ur NEGATIVE NEGATIVE   Labs Reviewed  POC URINE PREG, ED     No Known Allergies  Past Medical History:  Diagnosis Date  . Anxiety   . Asthma   . Hemorrhoids    Social History   Socioeconomic History  . Marital status: Single    Spouse name: Not on file  . Number of children: Not on file  . Years of education: Not on file  . Highest education level: Not on file  Occupational History  . Not on file  Social Needs  . Financial resource strain: Not on file  . Food  insecurity    Worry: Not on file    Inability: Not on file  . Transportation needs    Medical: Not on file    Non-medical: Not on file  Tobacco Use  . Smoking status: Never Smoker  . Smokeless tobacco: Never Used  Substance and Sexual Activity  . Alcohol use: No  . Drug use: No  . Sexual activity: Yes    Birth control/protection: None  Lifestyle  . Physical activity    Days per week: Not on file    Minutes per session: Not on file  . Stress: Not on file  Relationships  . Social Musicianconnections    Talks on phone: Not on file    Gets together: Not on file    Attends religious service: Not on file    Active member of club or organization: Not on file    Attends meetings of clubs or organizations: Not on file    Relationship status: Not on file  . Intimate partner violence    Fear of current or ex partner: Not on file    Emotionally abused: Not on file    Physically abused: Not on file    Forced sexual activity: Not on file  Other Topics Concern  . Not on file  Social History Narrative   ** Merged History Encounter **       Family History  Problem Relation Age of Onset  . Cancer Father      Mardella LaymanHagler, Jeramie Scogin, MD 07/12/19 [phone]

## 2019-11-10 ENCOUNTER — Encounter (HOSPITAL_COMMUNITY): Payer: Self-pay | Admitting: Emergency Medicine

## 2019-11-10 ENCOUNTER — Ambulatory Visit (HOSPITAL_COMMUNITY)
Admission: EM | Admit: 2019-11-10 | Discharge: 2019-11-10 | Disposition: A | Discharge status: Home / Self Care | Payer: Medicaid Other | Attending: Family Medicine | Admitting: Family Medicine

## 2019-11-10 ENCOUNTER — Other Ambulatory Visit: Payer: Self-pay

## 2019-11-10 DIAGNOSIS — M79642 Pain in left hand: Secondary | ICD-10-CM

## 2019-11-10 DIAGNOSIS — M79641 Pain in right hand: Secondary | ICD-10-CM

## 2019-11-10 DIAGNOSIS — M654 Radial styloid tenosynovitis [de Quervain]: Secondary | ICD-10-CM

## 2019-11-10 MED ORDER — PREDNISONE 10 MG PO TABS: ORAL_TABLET | ORAL | 0 refills | Status: DC

## 2019-11-10 NOTE — ED Triage Notes (Signed)
Pt c/o bilateral hand swelling x1 week, states its painful to make a fist, pain travels up her arms.

## 2019-11-10 NOTE — ED Provider Notes (Signed)
[facility]    CSN: [id number] Arrival date & time: 11/10/19  1649      History   Chief Complaint No chief complaint on file. Bilateral hand swelling and pain  HPI [name] [name] is a 38 y.o. female no significant past medical history presenting today for evaluation of bilateral hand pain.  Patient states that over the past week she has had pain in both of her hands.  Pain is been worse in her right hand.  At times pain will shoot up her arm into her shoulder.  Occasionally will have tingling sensations throughout all 5 fingers.  States that she has been taking ibuprofen 1600 mg without relief.  She notes that she is right-handed.  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  Denies specific injury or triggering incident.  HPI  Past Medical History:  Diagnosis Date  . Anxiety   . Asthma   . Hemorrhoids     Patient Active Problem List   Diagnosis Date Noted  . Pyelonephritis 05/18/2013    Past Surgical History:  Procedure Laterality Date  . TUBAL LIGATION    . TUBAL LIGATION  2006    OB History    Gravida  0   Para  0   Term  0   Preterm  0   AB  0   Living        SAB  0   TAB  0   Ectopic  0   Multiple      Live Births               Home Medications    Prior to Admission medications   Medication Sig Start Date End Date Taking? Authorizing Provider  ibuprofen (ADVIL) 800 MG tablet Take 1 tablet (800 mg total) by mouth 3 (three) times daily with meals. 07/07/19   Mardella LaymanHagler, Brian, MD  predniSONE (DELTASONE) 10 MG tablet Take 6 tab on day 1, 5 tab on day 2, 4 tab on day 3, 3 tab on day 4, 2 tab on day 5, 1 tab on day 6, take with food 11/10/19   Wieters, Hallie C, PA-C  albuterol (PROVENTIL HFA;VENTOLIN HFA) 108 (90 Base) MCG/ACT inhaler Inhale 2 puffs into the lungs every 4 (four) hours as needed for wheezing or shortness of breath. Patient not taking: Reported on 05/04/2017  01/16/16 07/07/19  Cheri Fowlerose, Kayla, PA-C  omeprazole (PRILOSEC) 20 MG capsule Take 1 capsule (20 mg total) by mouth daily. 04/13/19 11/10/19  Mardella LaymanHagler, Brian, MD  sucralfate (CARAFATE) 1 g tablet Take 1 tablet (1 g total) by mouth 4 (four) times daily -  with meals and at bedtime. 04/13/19 11/10/19  Mardella LaymanHagler, Brian, MD    Family History Family History  Problem Relation Age of Onset  . Cancer Father     Social History Social History   Tobacco Use  . Smoking status: Never Smoker  . Smokeless tobacco: Never Used  Substance Use Topics  . Alcohol use: No  . Drug use: No     Allergies   Patient has no known allergies.   Review of Systems Review of Systems  Constitutional: Negative for fatigue and fever.  Eyes: Negative for visual disturbance.  Respiratory: Negative for shortness of breath.   Cardiovascular: Negative for chest pain.  Gastrointestinal: Negative for abdominal pain, nausea and vomiting.  Musculoskeletal: Positive for arthralgias, joint swelling and myalgias.  Skin: Negative for color change, rash  and wound.  Neurological: Negative for dizziness, weakness, light-headedness and headaches.     Physical Exam Triage Vital Signs ED Triage Vitals  Enc Vitals Group     BP 11/10/19 1748 112/71     Pulse Rate 11/10/19 1748 65     Resp 11/10/19 1748 16     Temp 11/10/19 1748 98.7 F (37.1 C)     Temp src --      SpO2 11/10/19 1748 100 %     Weight --      Height --      Head Circumference --      Peak Flow --      Pain Score 11/10/19 1750 10     Pain Loc --      Pain Edu? --      Excl. in GC? --    No data found.  Updated Vital Signs BP 112/71   Pulse 65   Temp 98.7 F (37.1 C)   Resp 16   LMP 11/08/2019   SpO2 100%   Visual Acuity Right Eye Distance:   Left Eye Distance:   Bilateral Distance:    Right Eye Near:   Left Eye Near:    Bilateral Near:     Physical Exam Vitals signs and nursing note reviewed.  Constitutional:      Appearance: She is  well-developed.     Comments: No acute distress  HENT:     Head: Normocephalic and atraumatic.     Nose: Nose normal.  Eyes:     Conjunctiva/sclera: Conjunctivae normal.  Neck:     Musculoskeletal: Neck supple.  Cardiovascular:     Rate and Rhythm: Normal rate.  Pulmonary:     Effort: Pulmonary effort is normal. No respiratory distress.  Abdominal:     General: There is no distension.  Musculoskeletal: Normal range of motion.     Comments: Right hand/wrist: No obvious swelling or deformity, no discoloration or erythema, tenderness to palpation of distal radius extending into carpals and first through fifth metacarpals, less tender over distal ulna, full active range of motion of fingers, positive Finkelstein's, negative Tinel's  Right elbow and shoulder nontender to palpation over bony prominences, full active range of motion of elbow and shoulder  Left hand/wrist: Mild tenderness throughout carpals and metacarpals, full active range of motion of fingers, negative Finkelstein's.  Skin:    General: Skin is warm and dry.  Neurological:     Mental Status: She is alert and oriented to person, place, and time.      UC Treatments / Results  Labs (all labs ordered are listed, but only abnormal results are displayed) Labs Reviewed - No data to display  EKG   Radiology No results found.  Procedures Procedures (including critical care time)  Medications Ordered in UC Medications - No data to display  Initial Impression / Assessment and Plan / UC Course  I have reviewed the triage vital signs and the nursing notes.  Pertinent labs & imaging results that were available during my care of the patient were reviewed by me and considered in my medical decision making (see chart for details).     Symptoms suggestive of likely tendinitis, possible de Quervain's likely secondary to repetitive nature of using cans at work.  Providing thumb spica on right for rest, course of  prednisone, ice, activity modification.  Neurovascularly intact.  Do not suspect acute bony abnormality.  Discussed strict return precautions. Patient verbalized understanding and is agreeable with plan.  Final Clinical Impressions(s) / UC Diagnoses   Final diagnoses:  Bilateral hand pain  De Quervain's tenosynovitis, right     Discharge Instructions     Please wear wrist brace over the next 1 to 2 weeks to help wrist/tendons rest Begin prednisone taper in the morning beginning with 6 tablets, decrease by 1 tablet each day until completion Afterward may continue with ibuprofen 800 every 8 hours and Tylenol every 4-6 hours [phone] mg Please rest and ice the wrist  Follow-up if symptoms not resolving or worsening   ED Prescriptions    Medication Sig Dispense Auth. Provider   predniSONE (DELTASONE) 10 MG tablet Take 6 tab on day 1, 5 tab on day 2, 4 tab on day 3, 3 tab on day 4, 2 tab on day 5, 1 tab on day 6, take with food 21 tablet Wieters, Hallie C, PA-C     PDMP not reviewed this encounter.   Janith Lima, [place] 11/10/19 1947

## 2019-11-10 NOTE — Discharge Instructions (Signed)
Please wear wrist brace over the next 1 to 2 weeks to help wrist/tendons rest Begin prednisone taper in the morning beginning with 6 tablets, decrease by 1 tablet each day until completion Afterward may continue with ibuprofen 800 every 8 hours and Tylenol every 4-6 hours [phone] mg Please rest and ice the wrist  Follow-up if symptoms not resolving or worsening

## 2020-02-11 DIAGNOSIS — R519 Headache, unspecified: Secondary | ICD-10-CM | POA: Diagnosis not present

## 2020-02-11 DIAGNOSIS — N939 Abnormal uterine and vaginal bleeding, unspecified: Secondary | ICD-10-CM | POA: Diagnosis not present

## 2020-02-11 DIAGNOSIS — R8290 Unspecified abnormal findings in urine: Secondary | ICD-10-CM | POA: Diagnosis not present

## 2020-04-12 DIAGNOSIS — N921 Excessive and frequent menstruation with irregular cycle: Secondary | ICD-10-CM | POA: Diagnosis not present

## 2020-04-12 DIAGNOSIS — Z124 Encounter for screening for malignant neoplasm of cervix: Secondary | ICD-10-CM | POA: Diagnosis not present

## 2020-04-12 DIAGNOSIS — Z01419 Encounter for gynecological examination (general) (routine) without abnormal findings: Secondary | ICD-10-CM | POA: Diagnosis not present

## 2020-04-12 DIAGNOSIS — F32A Depression, unspecified: Secondary | ICD-10-CM | POA: Insufficient documentation

## 2020-04-12 DIAGNOSIS — Z113 Encounter for screening for infections with a predominantly sexual mode of transmission: Secondary | ICD-10-CM | POA: Diagnosis not present

## 2020-04-12 DIAGNOSIS — F329 Major depressive disorder, single episode, unspecified: Secondary | ICD-10-CM | POA: Diagnosis not present

## 2020-05-05 DIAGNOSIS — H5203 Hypermetropia, bilateral: Secondary | ICD-10-CM | POA: Insufficient documentation

## 2020-05-05 DIAGNOSIS — H50332 Intermittent monocular exotropia, left eye: Secondary | ICD-10-CM | POA: Insufficient documentation

## 2020-05-05 DIAGNOSIS — H02402 Unspecified ptosis of left eyelid: Secondary | ICD-10-CM | POA: Insufficient documentation

## 2020-05-06 DIAGNOSIS — H5213 Myopia, bilateral: Secondary | ICD-10-CM | POA: Diagnosis not present

## 2020-05-18 DIAGNOSIS — G43019 Migraine without aura, intractable, without status migrainosus: Secondary | ICD-10-CM | POA: Diagnosis not present

## 2020-05-19 DIAGNOSIS — R87618 Other abnormal cytological findings on specimens from cervix uteri: Secondary | ICD-10-CM | POA: Diagnosis not present

## 2020-05-19 DIAGNOSIS — F324 Major depressive disorder, single episode, in partial remission: Secondary | ICD-10-CM | POA: Diagnosis not present

## 2020-05-19 DIAGNOSIS — R7303 Prediabetes: Secondary | ICD-10-CM | POA: Insufficient documentation

## 2020-06-03 DIAGNOSIS — H52221 Regular astigmatism, right eye: Secondary | ICD-10-CM | POA: Diagnosis not present

## 2020-06-03 DIAGNOSIS — H5203 Hypermetropia, bilateral: Secondary | ICD-10-CM | POA: Diagnosis not present

## 2020-07-05 DIAGNOSIS — R7303 Prediabetes: Secondary | ICD-10-CM | POA: Diagnosis not present

## 2020-07-05 DIAGNOSIS — E559 Vitamin D deficiency, unspecified: Secondary | ICD-10-CM | POA: Diagnosis not present

## 2020-07-05 DIAGNOSIS — F329 Major depressive disorder, single episode, unspecified: Secondary | ICD-10-CM | POA: Diagnosis not present

## 2020-07-05 DIAGNOSIS — N921 Excessive and frequent menstruation with irregular cycle: Secondary | ICD-10-CM | POA: Diagnosis not present

## 2020-07-05 DIAGNOSIS — D509 Iron deficiency anemia, unspecified: Secondary | ICD-10-CM | POA: Diagnosis not present

## 2020-07-05 DIAGNOSIS — G479 Sleep disorder, unspecified: Secondary | ICD-10-CM | POA: Diagnosis not present

## 2020-07-05 DIAGNOSIS — F324 Major depressive disorder, single episode, in partial remission: Secondary | ICD-10-CM | POA: Diagnosis not present

## 2020-07-05 DIAGNOSIS — N946 Dysmenorrhea, unspecified: Secondary | ICD-10-CM | POA: Diagnosis not present

## 2020-07-05 DIAGNOSIS — N920 Excessive and frequent menstruation with regular cycle: Secondary | ICD-10-CM | POA: Diagnosis not present

## 2020-12-10 DIAGNOSIS — K5289 Other specified noninfective gastroenteritis and colitis: Secondary | ICD-10-CM | POA: Diagnosis not present

## 2020-12-10 DIAGNOSIS — R1011 Right upper quadrant pain: Secondary | ICD-10-CM | POA: Diagnosis not present

## 2020-12-10 DIAGNOSIS — R1013 Epigastric pain: Secondary | ICD-10-CM | POA: Diagnosis not present

## 2020-12-10 DIAGNOSIS — R160 Hepatomegaly, not elsewhere classified: Secondary | ICD-10-CM | POA: Diagnosis not present

## 2020-12-10 DIAGNOSIS — K760 Fatty (change of) liver, not elsewhere classified: Secondary | ICD-10-CM | POA: Diagnosis not present

## 2020-12-11 ENCOUNTER — Ambulatory Visit: Payer: Self-pay

## 2020-12-26 ENCOUNTER — Emergency Department (HOSPITAL_COMMUNITY): Payer: Medicaid Other

## 2020-12-26 ENCOUNTER — Encounter (HOSPITAL_COMMUNITY): Payer: Self-pay | Admitting: Emergency Medicine

## 2020-12-26 ENCOUNTER — Emergency Department (HOSPITAL_COMMUNITY)
Admission: EM | Admit: 2020-12-26 | Discharge: 2020-12-26 | Disposition: A | Discharge status: Home / Self Care | Payer: Medicaid Other | Attending: Emergency Medicine | Admitting: Emergency Medicine

## 2020-12-26 DIAGNOSIS — J45909 Unspecified asthma, uncomplicated: Secondary | ICD-10-CM | POA: Diagnosis not present

## 2020-12-26 DIAGNOSIS — R109 Unspecified abdominal pain: Secondary | ICD-10-CM | POA: Diagnosis not present

## 2020-12-26 DIAGNOSIS — Z79899 Other long term (current) drug therapy: Secondary | ICD-10-CM | POA: Insufficient documentation

## 2020-12-26 DIAGNOSIS — N3001 Acute cystitis with hematuria: Secondary | ICD-10-CM | POA: Diagnosis not present

## 2020-12-26 DIAGNOSIS — R1110 Vomiting, unspecified: Secondary | ICD-10-CM | POA: Diagnosis not present

## 2020-12-26 DIAGNOSIS — R1084 Generalized abdominal pain: Secondary | ICD-10-CM | POA: Diagnosis present

## 2020-12-26 LAB — CBC WITH DIFFERENTIAL/PLATELET
Abs Immature Granulocytes: 0.06 10*3/uL (ref 0.00–0.07)
Basophils Absolute: 0.1 10*3/uL (ref 0.0–0.1)
Basophils Relative: 1 %
Eosinophils Absolute: 0.1 10*3/uL (ref 0.0–0.5)
Eosinophils Relative: 0 %
HCT: 34.6 % — ABNORMAL LOW (ref 36.0–46.0)
Hemoglobin: 10.1 g/dL — ABNORMAL LOW (ref 12.0–15.0)
Immature Granulocytes: 0 %
Lymphocytes Relative: 18 %
Lymphs Abs: 2.5 10*3/uL (ref 0.7–4.0)
MCH: 22.3 pg — ABNORMAL LOW (ref 26.0–34.0)
MCHC: 29.2 g/dL — ABNORMAL LOW (ref 30.0–36.0)
MCV: 76.5 fL — ABNORMAL LOW (ref 80.0–100.0)
Monocytes Absolute: 0.7 10*3/uL (ref 0.1–1.0)
Monocytes Relative: 5 %
Neutro Abs: 10.6 10*3/uL — ABNORMAL HIGH (ref 1.7–7.7)
Neutrophils Relative %: 76 %
Platelets: 426 10*3/uL — ABNORMAL HIGH (ref 150–400)
RBC: 4.52 MIL/uL (ref 3.87–5.11)
RDW: 16.7 % — ABNORMAL HIGH (ref 11.5–15.5)
WBC: 13.9 10*3/uL — ABNORMAL HIGH (ref 4.0–10.5)
nRBC: 0 % (ref 0.0–0.2)

## 2020-12-26 LAB — URINALYSIS, ROUTINE W REFLEX MICROSCOPIC
Bilirubin Urine: NEGATIVE
Glucose, UA: NEGATIVE mg/dL
Ketones, ur: 20 mg/dL — AB
Nitrite: NEGATIVE
Protein, ur: 100 mg/dL — AB
Specific Gravity, Urine: 1.026 (ref 1.005–1.030)
WBC, UA: >50 WBC/hpf — ABNORMAL HIGH (ref 0–5)
pH: 6 (ref 5.0–8.0)

## 2020-12-26 LAB — COMPREHENSIVE METABOLIC PANEL
ALT: 15 U/L (ref 0–44)
AST: 16 U/L (ref 15–41)
Albumin: 4.5 g/dL (ref 3.5–5.0)
Alkaline Phosphatase: 62 U/L (ref 38–126)
Anion gap: 10 (ref 5–15)
BUN: 9 mg/dL (ref 6–20)
CO2: 25 mmol/L (ref 22–32)
Calcium: 9.4 mg/dL (ref 8.9–10.3)
Chloride: 102 mmol/L (ref 98–111)
Creatinine, Ser: 0.75 mg/dL (ref 0.44–1.00)
GFR, Estimated: >60 mL/min (ref >60)
Glucose, Bld: 96 mg/dL (ref 70–99)
Potassium: 3.4 mmol/L — ABNORMAL LOW (ref 3.5–5.1)
Sodium: 137 mmol/L (ref 135–145)
Total Bilirubin: 0.7 mg/dL (ref 0.3–1.2)
Total Protein: 9.4 g/dL — ABNORMAL HIGH (ref 6.5–8.1)

## 2020-12-26 LAB — LIPASE, BLOOD: Lipase: 40 U/L (ref 11–51)

## 2020-12-26 LAB — I-STAT BETA HCG BLOOD, ED (MC, WL, AP ONLY): I-stat hCG, quantitative: <5 m[IU]/mL (ref <5)

## 2020-12-26 MED ORDER — ONDANSETRON 4 MG PO TBDP: 4.0000 mg | ORAL_TABLET | Freq: Three times a day (TID) | ORAL | 0 refills | Status: DC | PRN

## 2020-12-26 MED ORDER — ONDANSETRON HCL 4 MG/2ML IJ SOLN
4.0000 mg | Freq: Once | INTRAMUSCULAR | Status: AC
  Administered 2020-12-26: 4 mg via INTRAVENOUS
  Filled 2020-12-26: qty 2

## 2020-12-26 MED ORDER — FENTANYL CITRATE (PF) 100 MCG/2ML IJ SOLN
50.0000 ug | Freq: Once | INTRAMUSCULAR | Status: AC
  Administered 2020-12-26: 50 ug via INTRAVENOUS
  Filled 2020-12-26: qty 2

## 2020-12-26 MED ORDER — IOHEXOL 300 MG/ML  SOLN
100.0000 mL | Freq: Once | INTRAMUSCULAR | Status: AC | PRN
  Administered 2020-12-26: 100 mL via INTRAVENOUS

## 2020-12-26 MED ORDER — SODIUM CHLORIDE 0.9 % IV SOLN
1.0000 g | Freq: Once | INTRAVENOUS | Status: AC
  Administered 2020-12-26: 1 g via INTRAVENOUS
  Filled 2020-12-26: qty 10

## 2020-12-26 MED ORDER — CEPHALEXIN 500 MG PO CAPS: 500.0000 mg | ORAL_CAPSULE | Freq: Two times a day (BID) | ORAL | 0 refills | Status: AC

## 2020-12-26 MED ORDER — SODIUM CHLORIDE 0.9 % IV BOLUS
1000.0000 mL | Freq: Once | INTRAVENOUS | Status: AC
  Administered 2020-12-26: 1000 mL via INTRAVENOUS

## 2020-12-26 NOTE — ED Notes (Signed)
Refused vitals 

## 2020-12-26 NOTE — ED Notes (Signed)
Pt ambulated to restroom without assistance.

## 2020-12-26 NOTE — ED Provider Notes (Signed)
[facility]EMERGENCY [facility]PT Provider Note   CSN: [id number] Arrival date & time: 12/26/20  0303     History Chief Complaint  Patient presents with  . Abdominal Pain    [name] [name] is a 40 y.o. female with past medical history of anxiety, asthma, pyelonephritis that presents the emergency department today for abdominal pain, nausea, vomiting, dysuria since last night.  Patient states that she started having the symptoms last night, abdominal pain is generalized and more in the suprapubic region.  Denies any vaginal complaints.  Denies any chance of STDs or vaginal bleeding.  Denies any headache, myalgias, cough or upper respiratory infectious symptoms.  Has been vaccinated against Covid.  Denies any sick contacts.  States that her vomiting and diarrhea subsided when she got here had one episode of nonbloody diarrhea and a couple episodes of nonbillious nonbloody emesis, unfortunately has been waiting in the waiting room for over 17 hours.  States that she has not had any episodes of diarrhea or vomiting since then however does feel nauseous.  States that she has not eaten or drinking in the last 17 hours.  Denies any back pain.  Does admit to some dysuria that began yesterday.  Denies any headache.  No other complaints at this time.  Does admit to hysterectomy, no other abdominal surgeries. HPI     Past Medical History:  Diagnosis Date  . Anxiety   . Asthma   . Hemorrhoids     Patient Active Problem List   Diagnosis Date Noted  . Pyelonephritis 05/18/2013    Past Surgical History:  Procedure Laterality Date  . TUBAL LIGATION    . TUBAL LIGATION  2006     OB History    Gravida  0   Para  0   Term  0   Preterm  0   AB  0   Living        SAB  0   IAB  0   Ectopic  0   Multiple      Live Births              Family History  Problem Relation Age of Onset  . Cancer Father     Social History   Tobacco Use  . Smoking status: Never  Smoker  . Smokeless tobacco: Never Used  Substance Use Topics  . Alcohol use: No  . Drug use: No    Home Medications Prior to Admission medications   Medication Sig Start Date End Date Taking? Authorizing Provider  cephALEXin (KEFLEX) 500 MG capsule Take 1 capsule (500 mg total) by mouth 2 (two) times daily for 7 days. 12/26/20 01/02/21 Yes Lyan Holck, Donne Anon, PA-C  norethindrone-ethinyl estradiol (LOESTRIN FE) 1-20 MG-MCG tablet Take 1 tablet by mouth daily.   Yes [provider]  ondansetron (ZOFRAN ODT) 4 MG disintegrating tablet Take 1 tablet (4 mg total) by mouth every 8 (eight) hours as needed for nausea or vomiting. 12/26/20  Yes Farrel Gordon, PA-C  sertraline (ZOLOFT) 25 MG tablet Take 25 mg by mouth daily.   Yes [provider]  Vitamin D, Ergocalciferol, (DRISDOL) 1.25 MG (50000 UNIT) CAPS capsule Take 50,000 Units by mouth once a week. Take on Mondays 07/19/20  Yes [provider]  ibuprofen (ADVIL) 800 MG tablet Take 1 tablet (800 mg total) by mouth 3 (three) times daily with meals. Patient not taking: Reported on 12/26/2020 07/07/19   Mardella Layman, MD  predniSONE (DELTASONE) 10 MG tablet Take  6 tab on day 1, 5 tab on day 2, 4 tab on day 3, 3 tab on day 4, 2 tab on day 5, 1 tab on day 6, take with food Patient not taking: Reported on 12/26/2020 11/10/19   Wieters, Hallie C, PA-C  albuterol (PROVENTIL HFA;VENTOLIN HFA) 108 (90 Base) MCG/ACT inhaler Inhale 2 puffs into the lungs every 4 (four) hours as needed for wheezing or shortness of breath. Patient not taking: Reported on 05/04/2017 01/16/16 07/07/19  Cheri Fowler, PA-C  omeprazole (PRILOSEC) 20 MG capsule Take 1 capsule (20 mg total) by mouth daily. 04/13/19 11/10/19  Mardella Layman, MD  sucralfate (CARAFATE) 1 g tablet Take 1 tablet (1 g total) by mouth 4 (four) times daily -  with meals and at bedtime. 04/13/19 11/10/19  Mardella Layman, MD    Allergies    Patient has no known allergies.  Review of Systems   Review  of Systems  Constitutional: Negative for chills, diaphoresis, fatigue and fever.  HENT: Negative for congestion, sore throat and trouble swallowing.   Eyes: Negative for pain and visual disturbance.  Respiratory: Negative for cough, shortness of breath and wheezing.   Cardiovascular: Negative for chest pain, palpitations and leg swelling.  Gastrointestinal: Positive for abdominal pain, diarrhea, nausea and vomiting. Negative for abdominal distention.  Genitourinary: Positive for dysuria. Negative for difficulty urinating.  Musculoskeletal: Negative for back pain, neck pain and neck stiffness.  Skin: Negative for pallor.  Neurological: Negative for dizziness, speech difficulty, weakness and headaches.  Psychiatric/Behavioral: Negative for confusion.    Physical Exam Updated Vital Signs BP 111/85   Pulse 90   Temp 98.8 F (37.1 C)   Resp 15   Ht 5\' 4"  (1.626 m)   Wt 63.5 kg   SpO2 98%   BMI 24.03 kg/m   Physical Exam Constitutional:      General: She is not in acute distress.    Appearance: Normal appearance. She is not ill-appearing, toxic-appearing or diaphoretic.  HENT:     Mouth/Throat:     Mouth: Mucous membranes are moist.     Pharynx: Oropharynx is clear.  Eyes:     General: No scleral icterus.    Extraocular Movements: Extraocular movements intact.     Pupils: Pupils are equal, round, and reactive to light.  Cardiovascular:     Rate and Rhythm: Normal rate and regular rhythm.     Pulses: Normal pulses.     Heart sounds: Normal heart sounds.  Pulmonary:     Effort: Pulmonary effort is normal. No respiratory distress.     Breath sounds: Normal breath sounds. No stridor. No wheezing, rhonchi or rales.  Chest:     Chest wall: No tenderness.  Abdominal:     General: Abdomen is flat. There is no distension.     Palpations: Abdomen is soft.     Tenderness: There is generalized abdominal tenderness and tenderness in the suprapubic area. There is no right CVA  tenderness, left CVA tenderness, guarding or rebound.  Musculoskeletal:        General: No swelling or tenderness. Normal range of motion.     Cervical back: Normal range of motion and neck supple. No rigidity.     Right lower leg: No edema.     Left lower leg: No edema.  Skin:    General: Skin is warm and dry.     Capillary Refill: Capillary refill takes less than 2 seconds.     Coloration: Skin is not pale.  Neurological:     General: No focal deficit present.     Mental Status: She is alert and oriented to person, place, and time.  Psychiatric:        Mood and Affect: Mood normal.        Behavior: Behavior normal.     ED Results / Procedures / Treatments   Labs (all labs ordered are listed, but only abnormal results are displayed) Labs Reviewed  COMPREHENSIVE METABOLIC PANEL - Abnormal; Notable for the following components:      Result Value   Potassium 3.4 (*)    Total Protein 9.4 (*)    All other components within normal limits  CBC WITH DIFFERENTIAL/PLATELET - Abnormal; Notable for the following components:   WBC 13.9 (*)    Hemoglobin 10.1 (*)    HCT 34.6 (*)    MCV 76.5 (*)    MCH 22.3 (*)    MCHC 29.2 (*)    RDW 16.7 (*)    Platelets 426 (*)    Neutro Abs 10.6 (*)    All other components within normal limits  URINALYSIS, ROUTINE W REFLEX MICROSCOPIC - Abnormal; Notable for the following components:   APPearance CLOUDY (*)    Hgb urine dipstick SMALL (*)    Ketones, ur 20 (*)    Protein, ur 100 (*)    Leukocytes,Ua LARGE (*)    WBC, UA >50 (*)    Bacteria, UA RARE (*)    All other components within normal limits  URINE CULTURE  LIPASE, BLOOD  I-STAT BETA HCG BLOOD, ED ([facility], [facility], [facility] ONLY)    EKG None  Radiology CT Abdomen Pelvis W Contrast  Result Date: 12/26/2020 CLINICAL DATA:  Abdominal pain, nausea, vomiting, diarrhea EXAM: CT ABDOMEN AND PELVIS WITH CONTRAST TECHNIQUE: Multidetector CT imaging of the abdomen and pelvis was performed using the  standard protocol following bolus administration of intravenous contrast. CONTRAST:  164mL OMNIPAQUE IOHEXOL 300 MG/ML  SOLN COMPARISON:  2018 FINDINGS: Lower chest: No acute abnormality. Hepatobiliary: No focal liver abnormality is seen. No gallstones, gallbladder wall thickening, or biliary dilatation. Pancreas: Unremarkable. No pancreatic ductal dilatation or surrounding inflammatory changes. Spleen: Normal in size without focal abnormality. Adrenals/Urinary Tract: Adrenals and kidneys are unremarkable. Partially distended bladder with marked circumferential wall thickening and mucosal hyperenhancement. Stomach/Bowel: Stomach is within normal limits. Bowel is normal in caliber. Vascular/Lymphatic: No significant vascular findings. No enlarged lymph nodes. Reproductive: Uterus and bilateral adnexa are unremarkable. Other: Trace free fluid in the dependent pelvis is likely physiologic. Abdominal wall is unremarkable. Musculoskeletal: No acute osseous abnormality. IMPRESSION: Abnormal appearance of the bladder suspicious for cystitis. Otherwise no acute abnormality identified. Electronically Signed   By: Macy Mis M.D.   On: 12/26/2020 20:48    Procedures Procedures (including critical care time)  Medications Ordered in ED Medications  sodium chloride 0.9 % bolus 1,000 mL (1,000 mLs Intravenous New Bag/Given 12/26/20 2009)  ondansetron (ZOFRAN) injection 4 mg (4 mg Intravenous Given 12/26/20 2008)  fentaNYL (SUBLIMAZE) injection 50 mcg (50 mcg Intravenous Given 12/26/20 2010)  iohexol (OMNIPAQUE) 300 MG/ML solution 100 mL (100 mLs Intravenous Contrast Given 12/26/20 2025)  cefTRIAXone (ROCEPHIN) 1 g in sodium chloride 0.9 % 100 mL IVPB (1 g Intravenous New Bag/Given 12/26/20 2204)    ED Course  I have reviewed the triage vital signs and the nursing notes.  Pertinent labs & imaging results that were available during my care of the patient were reviewed by me and considered in my medical  decision making  (see chart for details).    MDM Rules/Calculators/A&P                          [name] [name] is a 40 y.o. female with past medical history of anxiety, asthma, pyelonephritis that presents the emergency department today for abdominal pain, nausea, vomiting, dysuria since last night.  Patient had one episode of diarrhea last night, 3 episodes of vomiting last night.  Continues to feel nauseous, benign abdominal exam.  Will give IV fluids, Zofran and pain medication.  Patient afebrile, nontachycardic.  Patient with urinalysis suggestive of UTI, CMP unremarkable.  CBC with leukocytosis of 13.9, hemoglobin 10 which does appear to be patient's baseline.  No concerns of pyelonephritis.  Upon reevaluation patient states she feels much better, nausea and pain are much better than before.  I think patient symptoms are most likely from UTI, patient with one episode of diarrhea I think this is most likely unrelated.  However patient could have had gastroenteritis as well.  Patient will follow up with PCP.  Symptomatic treatment discussed, did encourage oral hydration, patient will do this at this time.  Doubt need for further emergent work up at this time. I explained the diagnosis and have given explicit precautions to return to the ER including for any other new or worsening symptoms. The patient understands and accepts the medical plan as it's been dictated and I have answered their questions. Discharge instructions concerning home care and prescriptions have been given. The patient is STABLE and is discharged to home in good condition.  Final Clinical Impression(s) / ED Diagnoses Final diagnoses:  Acute cystitis with hematuria    Rx / DC Orders ED Discharge Orders         Ordered    cephALEXin (KEFLEX) 500 MG capsule  2 times daily        12/26/20 2214    ondansetron (ZOFRAN ODT) 4 MG disintegrating tablet  Every 8 hours PRN        12/26/20 2219           Farrel Gordon, PA-C 12/26/20 2333     Gerhard Munch, MD 12/29/20 1301

## 2020-12-26 NOTE — Discharge Instructions (Addendum)
You are seen today for urinary tract infection in addition to dehydration.  Your CT abdomen is unremarkable, does show cystitis which is consistent with your urinary tract infection.  Please use the attached instructions and take the antibiotics as directed.  Patient to stay hydrated.  Also prescribed you nausea no medications that you can take if needed.  If you have any new or worsening concerning symptoms please come back to the emergency department, follow-up with your PCP in the next couple of days.  Your platelets were slightly elevated today, have these rechecked in the next couple of days with your PCP.  Your hemoglobin was also low, this does appear to be your baseline, make sure to take an iron supplement and follow-up with your PCP about this as well.  Please speak to your pharmacist about any new medications or side effects of medications that were prescribed today.

## 2020-12-26 NOTE — ED Triage Notes (Signed)
Pt reports nausea, vomiting, and diarrhea since midnight. Denies new foods or any cause she can think of. Denies sick contacts. Denies blood in emesis or stool. LPM 12/15/20.

## 2020-12-29 LAB — URINE CULTURE: Culture: >=100000 — AB

## 2020-12-30 ENCOUNTER — Telehealth: Payer: Self-pay | Admitting: Emergency Medicine

## 2020-12-30 NOTE — Progress Notes (Signed)
ED Antimicrobial Stewardship Positive Culture Follow Up   [name] [name] is an 40 y.o. female who presented to [facility] on 12/26/2020 with a chief complaint of  Chief Complaint  Patient presents with  . Abdominal Pain    Recent Results (from the past 720 hour(s))  Urine culture     Status: Abnormal   Collection Time: 12/26/20  7:31 PM   Specimen: Urine, Random  Result Value Ref Range Status   Specimen Description   Final    URINE, RANDOM Performed at [facility], [address] [place], [place] [address]    Special Requests   Final    NONE Performed at [facility], [address] [place], [place] [address]    Culture >=100,000 COLONIES/mL STAPHYLOCOCCUS SAPROPHYTICUS (A)  Final   Report Status 12/29/2020 FINAL  Final   Organism ID, Bacteria STAPHYLOCOCCUS SAPROPHYTICUS (A)  Final      Susceptibility   Staphylococcus saprophyticus - MIC*    CIPROFLOXACIN <=0.5 SENSITIVE Sensitive     GENTAMICIN <=0.5 SENSITIVE Sensitive     NITROFURANTOIN 128 RESISTANT Resistant     OXACILLIN >=4 RESISTANT Resistant     TETRACYCLINE <=1 SENSITIVE Sensitive     VANCOMYCIN <=0.5 SENSITIVE Sensitive     TRIMETH/SULFA <=10 SENSITIVE Sensitive     CLINDAMYCIN RESISTANT Resistant     RIFAMPIN <=0.5 SENSITIVE Sensitive     Inducible Clindamycin POSITIVE Resistant     * >=100,000 COLONIES/mL STAPHYLOCOCCUS SAPROPHYTICUS    [x]  Treated with keflex, organism resistant to prescribed antimicrobial  New antibiotic prescription: Bactrim DS 1 tab PO BID x 3 days  ED Provider: , PA-C   Arthor Captain, PharmD, BCPS 12/30/2020, 9:10 AM Clinical Pharmacist [phone]

## 2020-12-30 NOTE — Telephone Encounter (Signed)
Post ED Visit - Positive Culture Follow-up: Unsuccessful Patient Follow-up  Culture assessed and recommendations reviewed by:  []  , Pharm.D. []  Enzo Bi, Pharm.D., BCPS AQ-ID []  , Pharm.D., BCPS []  Celedonio Miyamoto, Pharm.D., BCPS []  [place], Garvin Fila.D., BCPS, AAHIVP []  , Pharm.D., BCPS, AAHIVP []  Georgina Pillion, PharmD []  , PharmD, BCPS  Positive urine culture  []  Patient discharged without antimicrobial prescription and treatment is now indicated [x]  Organism is resistant to prescribed ED discharge antimicrobial []  Patient with positive blood cultures   Unable to contact patient @ phone on file, letter will be sent to address on file  Plan: Bactrim DS one tab PO BID x three days, [place]  [address] Mareli Antunes 12/30/2020, 2:07 PM

## 2021-01-05 ENCOUNTER — Telehealth: Payer: Self-pay | Admitting: *Deleted

## 2021-01-05 NOTE — Telephone Encounter (Signed)
Post ED Visit - Positive Culture Follow-up: Successful Patient Follow-Up  Culture assessed and recommendations reviewed by:  []  , Pharm.D. []  Enzo Bi, Pharm.D., BCPS AQ-ID []  , Pharm.D., BCPS []  Celedonio Miyamoto, .D., BCPS []  [place], .D., BCPS, AAHIVP []  Georgina Pillion, Pharm.D., BCPS, AAHIVP []  [address], PharmD, BCPS []  , PharmD, BCPS []  [place], PharmD, BCPS []  [address], PharmD  Positive urine culture  []  Patient discharged without antimicrobial prescription and treatment is now indicated [x]  Organism is resistant to prescribed ED discharge antimicrobial []  Patient with positive blood cultures  Changes discussed with ED provider , PA New antibiotic prescription Bactrim DS I tab PO BID x 3 days, Stop Keflex Called to [facility] [phone]  Contacted patient, date 01/05/2021, time 0950   Lysle Pearl 01/05/2021, 9:51 AM

## 2021-04-11 ENCOUNTER — Other Ambulatory Visit: Payer: Self-pay

## 2021-04-11 ENCOUNTER — Ambulatory Visit (HOSPITAL_COMMUNITY)
Admission: RE | Admit: 2021-04-11 | Discharge: 2021-04-11 | Disposition: A | Discharge status: Home / Self Care | Payer: Medicaid Other | Source: Ambulatory Visit | Attending: Family Medicine | Admitting: Family Medicine

## 2021-04-11 ENCOUNTER — Encounter (HOSPITAL_COMMUNITY): Payer: Self-pay

## 2021-04-11 VITALS — BP 124/71 | HR 96 | Temp 99.1°F

## 2021-04-11 DIAGNOSIS — R1084 Generalized abdominal pain: Secondary | ICD-10-CM | POA: Diagnosis not present

## 2021-04-11 DIAGNOSIS — R0602 Shortness of breath: Secondary | ICD-10-CM | POA: Diagnosis not present

## 2021-04-11 DIAGNOSIS — M25511 Pain in right shoulder: Secondary | ICD-10-CM | POA: Diagnosis not present

## 2021-04-11 LAB — CBC
HCT: 33.8 % — ABNORMAL LOW (ref 36.0–46.0)
Hemoglobin: 10 g/dL — ABNORMAL LOW (ref 12.0–15.0)
MCH: 22.1 pg — ABNORMAL LOW (ref 26.0–34.0)
MCHC: 29.6 g/dL — ABNORMAL LOW (ref 30.0–36.0)
MCV: 74.6 fL — ABNORMAL LOW (ref 80.0–100.0)
Platelets: 446 10*3/uL — ABNORMAL HIGH (ref 150–400)
RBC: 4.53 MIL/uL (ref 3.87–5.11)
RDW: 18.4 % — ABNORMAL HIGH (ref 11.5–15.5)
WBC: 6.7 10*3/uL (ref 4.0–10.5)
nRBC: 0 % (ref 0.0–0.2)

## 2021-04-11 LAB — COMPREHENSIVE METABOLIC PANEL
ALT: 14 U/L (ref 0–44)
AST: 17 U/L (ref 15–41)
Albumin: 3.8 g/dL (ref 3.5–5.0)
Alkaline Phosphatase: 40 U/L (ref 38–126)
Anion gap: 7 (ref 5–15)
BUN: 5 mg/dL — ABNORMAL LOW (ref 6–20)
CO2: 27 mmol/L (ref 22–32)
Calcium: 9.3 mg/dL (ref 8.9–10.3)
Chloride: 104 mmol/L (ref 98–111)
Creatinine, Ser: 0.74 mg/dL (ref 0.44–1.00)
GFR, Estimated: >60 mL/min (ref >60)
Glucose, Bld: 118 mg/dL — ABNORMAL HIGH (ref 70–99)
Potassium: 3.2 mmol/L — ABNORMAL LOW (ref 3.5–5.1)
Sodium: 138 mmol/L (ref 135–145)
Total Bilirubin: 0.2 mg/dL — ABNORMAL LOW (ref 0.3–1.2)
Total Protein: 8 g/dL (ref 6.5–8.1)

## 2021-04-11 LAB — BRAIN NATRIURETIC PEPTIDE: B Natriuretic Peptide: 4.5 pg/mL (ref 0.0–100.0)

## 2021-04-11 NOTE — ED Notes (Signed)
Pt unable to give urine sample

## 2021-04-11 NOTE — ED Triage Notes (Signed)
Patient c/o severe cramping started 3-4 days before her actual menstrual cycle.  When period did come, the cramps did not let up.  Pt also c/o SOB late Sunday night, Monday morning, tightness of chest, feels like anxiety but it's not anxiety.  Pt states she woke up with right sided shoulder pain, feels like something is pinching it but it's not.  No apparent injury.  Patient has been alternating Motrin and Tylenol.

## 2021-04-11 NOTE — Discharge Instructions (Signed)
Go to the ER for further evaluation of your symptoms given the severity of your abdominal pain

## 2021-04-11 NOTE — ED Notes (Signed)
Patient is being discharged from the Urgent Care and sent to the Emergency Department via POV . Per Margit Hanks, patient is in need of higher level of care due to abdominal pain. Patient is aware and verbalizes understanding of plan of care.  Vitals:   04/11/21 1710  BP: 124/71  Pulse: 96  Temp: 99.1 F (37.3 C)  SpO2: 99%

## 2021-04-14 NOTE — ED Provider Notes (Signed)
[facility]    CSN: [id number] Arrival date & time: 04/11/21  1653      History   Chief Complaint Chief Complaint  Patient presents with  . Appointment    Painful menstrual, SOB, right sided shoulder pain    HPI [name] [name] is a 40 y.o. female.   Patient presenting today with numerous concerns.  She states she started with severe lower abdominal cramping the past 3 or 4 days that do not seem to be directly associated with her menstrual cycle, which just came on the last day or so.  She states she is unable to concentrate at work, has had to leave to go sit down several times, can barely sleep because of the severity of the cramping.  She denies nausea vomiting diarrhea, fever, chills, new foods or medications.  Trying over-the-counter pain relievers without relief.  She is also been having intermittent episodes of shortness of breath, chest tightness and feeling like she is easily fatigued when walking around or exercising.  Has had some bilateral lower leg swelling off and on additionally in the last few weeks.  Denies wheezing, does have a history of asthma and anxiety but notes that this does not feel consistent with either of those things.  She also notes that this morning she woke up with significant right shoulder pain that feels like a pinching sensation without any injury.  Denies swelling, discoloration, numbness or tingling down the arm.     Past Medical History:  Diagnosis Date  . Anxiety   . Asthma   . Hemorrhoids     Patient Active Problem List   Diagnosis Date Noted  . Pyelonephritis 05/18/2013    Past Surgical History:  Procedure Laterality Date  . TUBAL LIGATION    . TUBAL LIGATION  2006    OB History    Gravida  0   Para  0   Term  0   Preterm  0   AB  0   Living        SAB  0   IAB  0   Ectopic  0   Multiple      Live Births               Home Medications    Prior to Admission medications   Medication Sig Start  Date End Date Taking? Authorizing Provider  norethindrone-ethinyl estradiol (LOESTRIN FE) 1-20 MG-MCG tablet Take 1 tablet by mouth daily.   Yes [provider]  ibuprofen (ADVIL) 800 MG tablet Take 1 tablet (800 mg total) by mouth 3 (three) times daily with meals. Patient not taking: No sig reported 07/07/19   Mardella Layman, MD  ondansetron (ZOFRAN ODT) 4 MG disintegrating tablet Take 1 tablet (4 mg total) by mouth every 8 (eight) hours as needed for nausea or vomiting. 12/26/20   Farrel Gordon, PA-C  predniSONE (DELTASONE) 10 MG tablet Take 6 tab on day 1, 5 tab on day 2, 4 tab on day 3, 3 tab on day 4, 2 tab on day 5, 1 tab on day 6, take with food Patient not taking: No sig reported 11/10/19   Wieters, Hallie C, PA-C  sertraline (ZOLOFT) 25 MG tablet Take 25 mg by mouth daily.    [provider]  Vitamin D, Ergocalciferol, (DRISDOL) 1.25 MG (50000 UNIT) CAPS capsule Take 50,000 Units by mouth once a week. Take on Mondays 07/19/20   [provider]  albuterol (PROVENTIL HFA;VENTOLIN HFA) 108 (90 Base)  MCG/ACT inhaler Inhale 2 puffs into the lungs every 4 (four) hours as needed for wheezing or shortness of breath. Patient not taking: Reported on 05/04/2017 01/16/16 07/07/19  Cheri Fowler, PA-C  omeprazole (PRILOSEC) 20 MG capsule Take 1 capsule (20 mg total) by mouth daily. 04/13/19 11/10/19  Mardella Layman, MD  sucralfate (CARAFATE) 1 g tablet Take 1 tablet (1 g total) by mouth 4 (four) times daily -  with meals and at bedtime. 04/13/19 11/10/19  Mardella Layman, MD    Family History Family History  Problem Relation Age of Onset  . Cancer Father     Social History Social History   Tobacco Use  . Smoking status: Never Smoker  . Smokeless tobacco: Never Used  Substance Use Topics  . Alcohol use: No  . Drug use: No     Allergies   Patient has no known allergies.   Review of Systems Review of Systems Per HPI Physical Exam Triage Vital Signs ED Triage Vitals   Enc Vitals Group     BP 04/11/21 1710 124/71     Pulse Rate 04/11/21 1710 96     Resp --      Temp 04/11/21 1710 99.1 F (37.3 C)     Temp Source 04/11/21 1710 Oral     SpO2 04/11/21 1710 99 %     Weight --      Height --      Head Circumference --      Peak Flow --      Pain Score 04/11/21 1712 10     Pain Loc --      Pain Edu? --      Excl. in GC? --    No data found.  Updated Vital Signs BP 124/71 (BP Location: Left Arm)   Pulse 96   Temp 99.1 F (37.3 C) (Oral)   LMP 04/09/2021   SpO2 99%   Visual Acuity Right Eye Distance:   Left Eye Distance:   Bilateral Distance:    Right Eye Near:   Left Eye Near:    Bilateral Near:     Physical Exam Vitals and nursing note reviewed.  Constitutional:      Appearance: Normal appearance. She is not ill-appearing.  HENT:     Head: Atraumatic.     Nose: Nose normal.     Mouth/Throat:     Mouth: Mucous membranes are moist.     Pharynx: Oropharynx is clear.  Eyes:     Extraocular Movements: Extraocular movements intact.     Conjunctiva/sclera: Conjunctivae normal.  Cardiovascular:     Rate and Rhythm: Normal rate and regular rhythm.     Heart sounds: Normal heart sounds.  Pulmonary:     Effort: Pulmonary effort is normal. No respiratory distress.     Breath sounds: Normal breath sounds. No wheezing or rales.  Abdominal:     General: Bowel sounds are normal. There is no distension.     Palpations: Abdomen is soft.     Tenderness: There is no abdominal tenderness. There is no guarding.  Musculoskeletal:        General: Tenderness present. Normal range of motion.     Cervical back: Normal range of motion and neck supple.     Comments: Mild tenderness to palpation right anterior shoulder.  Range of motion intact.  Grip strength full and equal bilateral hands.  Skin:    General: Skin is warm and dry.  Neurological:     Mental Status: She is  alert and oriented to person, place, and time.     Comments: Bilateral upper  extremities neurovascularly intact  Psychiatric:        Mood and Affect: Mood normal.        Thought Content: Thought content normal.        Judgment: Judgment normal.      UC Treatments / Results  Labs (all labs ordered are listed, but only abnormal results are displayed) Labs Reviewed  COMPREHENSIVE METABOLIC PANEL - Abnormal; Notable for the following components:      Result Value   Potassium 3.2 (*)    Glucose, Bld 118 (*)    BUN 5 (*)    Total Bilirubin 0.2 (*)    All other components within normal limits  CBC - Abnormal; Notable for the following components:   Hemoglobin 10.0 (*)    HCT 33.8 (*)    MCV 74.6 (*)    MCH 22.1 (*)    MCHC 29.6 (*)    RDW 18.4 (*)    Platelets 446 (*)    All other components within normal limits  BRAIN NATRIURETIC PEPTIDE  CBC WITH DIFFERENTIAL/PLATELET    EKG   Radiology No results found.  Procedures Procedures (including critical care time)  Medications Ordered in UC Medications - No data to display  Initial Impression / Assessment and Plan / UC Course  I have reviewed the triage vital signs and the nursing notes.  Pertinent labs & imaging results that were available during my care of the patient were reviewed by me and considered in my medical decision making (see chart for details).     Vital signs and exam very reassuring today, EKG normal sinus rhythm with no acute ST or T wave changes.  Her lungs are clear to auscultation bilaterally today and I do not find anything to explain her new onset shortness of breath, intermittent leg swelling.  Her abdomen is nondistended but very tender to palpation, given the extent of her discomfort as well as her new onset shortness of breath do recommend that she go to the ED for evaluation and possibly some imaging and further lab work.  We will get basic labs in case she decides not to go today but do recommend further evaluation.  Shoulder likely muscular in nature, discussed  over-the-counter pain relievers, heat, massage for this.  Final Clinical Impressions(s) / UC Diagnoses   Final diagnoses:  Generalized abdominal pain  SOB (shortness of breath)  Acute pain of right shoulder     Discharge Instructions     Go to the ER for further evaluation of your symptoms given the severity of your abdominal pain    ED Prescriptions    None     PDMP not reviewed this encounter.   Particia Nearing, [place] 04/14/21 1258

## 2022-01-21 ENCOUNTER — Emergency Department (HOSPITAL_COMMUNITY): Payer: Medicaid Other

## 2022-01-21 ENCOUNTER — Encounter (HOSPITAL_COMMUNITY): Payer: Self-pay | Admitting: Emergency Medicine

## 2022-01-21 ENCOUNTER — Emergency Department (HOSPITAL_COMMUNITY)
Admission: EM | Admit: 2022-01-21 | Discharge: 2022-01-21 | Disposition: A | Discharge status: Home / Self Care | Payer: Medicaid Other | Attending: Emergency Medicine | Admitting: Emergency Medicine

## 2022-01-21 ENCOUNTER — Other Ambulatory Visit: Payer: Self-pay

## 2022-01-21 DIAGNOSIS — S8012XA Contusion of left lower leg, initial encounter: Secondary | ICD-10-CM | POA: Insufficient documentation

## 2022-01-21 DIAGNOSIS — Y92410 Unspecified street and highway as the place of occurrence of the external cause: Secondary | ICD-10-CM | POA: Diagnosis not present

## 2022-01-21 DIAGNOSIS — R079 Chest pain, unspecified: Secondary | ICD-10-CM | POA: Diagnosis not present

## 2022-01-21 DIAGNOSIS — M545 Low back pain, unspecified: Secondary | ICD-10-CM | POA: Diagnosis not present

## 2022-01-21 DIAGNOSIS — M549 Dorsalgia, unspecified: Secondary | ICD-10-CM | POA: Insufficient documentation

## 2022-01-21 DIAGNOSIS — M25571 Pain in right ankle and joints of right foot: Secondary | ICD-10-CM | POA: Diagnosis not present

## 2022-01-21 DIAGNOSIS — T07XXXA Unspecified multiple injuries, initial encounter: Secondary | ICD-10-CM

## 2022-01-21 DIAGNOSIS — S8011XA Contusion of right lower leg, initial encounter: Secondary | ICD-10-CM | POA: Diagnosis not present

## 2022-01-21 DIAGNOSIS — M79662 Pain in left lower leg: Secondary | ICD-10-CM | POA: Diagnosis not present

## 2022-01-21 DIAGNOSIS — S99922A Unspecified injury of left foot, initial encounter: Secondary | ICD-10-CM | POA: Diagnosis not present

## 2022-01-21 DIAGNOSIS — M5431 Sciatica, right side: Secondary | ICD-10-CM | POA: Diagnosis not present

## 2022-01-21 DIAGNOSIS — Z20822 Contact with and (suspected) exposure to covid-19: Secondary | ICD-10-CM | POA: Insufficient documentation

## 2022-01-21 DIAGNOSIS — M7989 Other specified soft tissue disorders: Secondary | ICD-10-CM | POA: Diagnosis not present

## 2022-01-21 DIAGNOSIS — R509 Fever, unspecified: Secondary | ICD-10-CM | POA: Diagnosis not present

## 2022-01-21 DIAGNOSIS — M25572 Pain in left ankle and joints of left foot: Secondary | ICD-10-CM | POA: Diagnosis not present

## 2022-01-21 DIAGNOSIS — J45909 Unspecified asthma, uncomplicated: Secondary | ICD-10-CM | POA: Diagnosis not present

## 2022-01-21 DIAGNOSIS — Z041 Encounter for examination and observation following transport accident: Secondary | ICD-10-CM | POA: Diagnosis not present

## 2022-01-21 DIAGNOSIS — S8991XA Unspecified injury of right lower leg, initial encounter: Secondary | ICD-10-CM | POA: Diagnosis present

## 2022-01-21 DIAGNOSIS — M79605 Pain in left leg: Secondary | ICD-10-CM | POA: Diagnosis not present

## 2022-01-21 LAB — RESP PANEL BY RT-PCR (FLU A&B, COVID) ARPGX2
Influenza A by PCR: NEGATIVE
Influenza B by PCR: NEGATIVE
SARS Coronavirus 2 by RT PCR: NEGATIVE

## 2022-01-21 MED ORDER — IBUPROFEN 400 MG PO TABS
600.0000 mg | ORAL_TABLET | Freq: Once | ORAL | Status: AC
  Administered 2022-01-21: 600 mg via ORAL
  Filled 2022-01-21: qty 1

## 2022-01-21 MED ORDER — HYDROCODONE-ACETAMINOPHEN 5-325 MG PO TABS
1.0000 | ORAL_TABLET | Freq: Once | ORAL | Status: AC
  Administered 2022-01-21: 1 via ORAL
  Filled 2022-01-21: qty 1

## 2022-01-21 MED ORDER — IBUPROFEN 600 MG PO TABS: 600.0000 mg | ORAL_TABLET | Freq: Four times a day (QID) | ORAL | 0 refills | Status: DC | PRN

## 2022-01-21 MED ORDER — METHOCARBAMOL 500 MG PO TABS: 500.0000 mg | ORAL_TABLET | Freq: Two times a day (BID) | ORAL | 0 refills | Status: DC
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
## 2022-01-21 NOTE — ED Notes (Signed)
Ortho tech called for second time and advised that he is on his way to see the pt
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
## 2022-01-21 NOTE — ED Notes (Signed)
Pt transported to radiology at this time °

## 2022-01-21 NOTE — ED Notes (Signed)
Ortho tech at bedside 
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
## 2022-01-22 NOTE — Progress Notes (Signed)
Orthopedic Tech Progress Note Patient Details:  [name] [name] [birth year] [id number]  Ortho Devices Type of Ortho Device: Crutches Ortho Device/Splint Interventions: Ordered, Application, Adjustment   Post Interventions Patient Tolerated: Well Instructions Provided: Care of device, Adjustment of device  Trinna Post 01/22/2022, 5:01 AM
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
## 2024-03-18 ENCOUNTER — Ambulatory Visit (HOSPITAL_COMMUNITY)
Admission: EM | Admit: 2024-03-18 | Discharge: 2024-03-18 | Disposition: A | Discharge status: Home / Self Care | Attending: Family Medicine | Admitting: Family Medicine

## 2024-03-18 ENCOUNTER — Encounter (HOSPITAL_COMMUNITY): Payer: Self-pay | Admitting: Emergency Medicine

## 2024-03-18 DIAGNOSIS — U071 COVID-19: Secondary | ICD-10-CM | POA: Diagnosis not present

## 2024-03-18 DIAGNOSIS — R051 Acute cough: Secondary | ICD-10-CM

## 2024-03-18 LAB — POC COVID19/FLU A&B COMBO
Covid Antigen, POC: POSITIVE — AB
Influenza A Antigen, POC: NEGATIVE
Influenza B Antigen, POC: NEGATIVE

## 2024-03-18 MED ORDER — PROMETHAZINE-DM 6.25-15 MG/5ML PO SYRP: 5.0000 mL | ORAL_SOLUTION | Freq: Four times a day (QID) | ORAL | 0 refills | Status: DC | PRN

## 2024-03-18 NOTE — ED Triage Notes (Signed)
 Pt having headaches, congestion, cough, body aches since yesterday. Tried taking Motrin, Tylenol, Dayquil, Nyquil that didn't help.
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
## 2024-03-18 NOTE — Discharge Instructions (Signed)
 Results for orders placed or performed during the hospital encounter of 03/18/24  POC Covid19/Flu A&B Antigen   Collection Time: 03/18/24  1:16 PM  Result Value Ref Range   Influenza A Antigen, POC Negative Negative   Influenza B Antigen, POC Negative Negative   Covid Antigen, POC Positive (A) Negative   Meds ordered this encounter  Medications   promethazine-dextromethorphan (PROMETHAZINE-DM) 6.25-15 MG/5ML syrup    Sig: Take 5 mLs by mouth 4 (four) times daily as needed for cough.    Dispense:  118 mL    Refill:  0

## 2024-12-20 ENCOUNTER — Encounter: Payer: Self-pay | Admitting: Emergency Medicine

## 2024-12-20 ENCOUNTER — Ambulatory Visit
Admission: EM | Admit: 2024-12-20 | Discharge: 2024-12-20 | Disposition: A | Discharge status: Home / Self Care | Attending: Family Medicine | Admitting: Family Medicine

## 2024-12-20 DIAGNOSIS — J111 Influenza due to unidentified influenza virus with other respiratory manifestations: Secondary | ICD-10-CM | POA: Diagnosis not present

## 2024-12-20 DIAGNOSIS — J069 Acute upper respiratory infection, unspecified: Secondary | ICD-10-CM | POA: Diagnosis not present

## 2024-12-20 LAB — POCT INFLUENZA A/B
Influenza A, POC: NEGATIVE
Influenza B, POC: NEGATIVE

## 2024-12-20 LAB — POC SOFIA SARS ANTIGEN FIA: SARS Coronavirus 2 Ag: NEGATIVE

## 2024-12-20 MED ORDER — BENZONATATE 100 MG PO CAPS: 100.0000 mg | ORAL_CAPSULE | Freq: Three times a day (TID) | ORAL | 0 refills | Status: AC | PRN

## 2024-12-20 MED ORDER — OSELTAMIVIR PHOSPHATE 75 MG PO CAPS: 75.0000 mg | ORAL_CAPSULE | Freq: Two times a day (BID) | ORAL | 0 refills | Status: AC

## 2024-12-20 NOTE — ED Provider Notes (Signed)
 " [facility]    CSN: [id number] Arrival date & time: 12/20/24  1551      History   Chief Complaint Chief Complaint  Patient presents with   Fever   Generalized Body Aches   Nasal Congestion    cough   Cough    HPI [name] [name] is a 43 y.o. female.    Fever Associated symptoms: cough   Cough Associated symptoms: fever    Here for nasal congestion/rhinorrhea, cough, and fever.  The highest fever has been up to 103.  No nausea or vomiting or diarrhea.  Her chest is felt heavy and she gets out of breath and dizzy with walking.  She denies any history of asthma.  No diabetes.  She was exposed to her 45-month-old godson who tested positive for the flu the day after she was around him.  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 Authorizing Provider  Acetaminophen  Extra Strength 500 MG TABS Take 1 tablet by mouth every 4 (four) hours as needed. 09/23/24  Yes [provider]  benzonatate (TESSALON) 100 MG capsule Take 1 capsule (100 mg total) by mouth 3 (three) times daily as needed for cough. 12/20/24  Yes Vonna Sharlet POUR, MD  oseltamivir  (TAMIFLU ) 75 MG capsule  Take 1 capsule (75 mg total) by mouth every 12 (twelve) hours. 12/20/24  Yes Vonna Sharlet POUR, MD  albuterol  (PROVENTIL ) (2.5 MG/3ML) 0.083% nebulizer solution 2.5 mg. Patient not taking: Reported on 12/20/2024 10/12/19   [provider]  hydrOXYzine  (VISTARIL ) 25 MG capsule Take 25 mg by mouth. Patient not taking: Reported on 12/20/2024 10/12/19   [provider]  Lidocaine ([facility] AFTERSUN ALOE/LIDOCAINE) 0.5 % GEL Apply to hands as needed Patient not taking: Reported on 12/20/2024 03/23/14   [provider]  LORazepam  (ATIVAN ) 0.5 MG tablet Take 0.5 mg by mouth. Patient not taking: Reported on 12/20/2024 10/12/19   [provider]  norethindrone-ethinyl estradiol-iron (LOESTRIN FE) 1.5-30 MG-MCG tablet TAKE ONE TABLET BY MOUTH DAILY SKIPPING PLACEBOS Patient not taking: Reported on 12/20/2024 07/13/20   [provider]  omeprazole  (PRILOSEC ) 20 MG capsule Take 20 mg by mouth. Patient not taking: Reported on 12/20/2024 04/13/19   [provider]  sertraline (ZOLOFT) 25 MG tablet Take 25 mg by mouth. Patient not taking: Reported on 12/20/2024 07/05/20   [provider]  sucralfate  (CARAFATE ) 1 g tablet Take by mouth. Patient not taking: Reported on 12/20/2024 04/13/19   [provider]  SUMAtriptan (IMITREX) 50 MG tablet Take 50 mg by mouth. Patient not taking: Reported on 12/20/2024 05/18/20   [provider]    Family History Family History  Problem Relation Age of Onset   Cancer Father     Social History Social History[1]   Allergies   Patient has no known allergies.   Review of Systems Review of Systems  Constitutional:  Positive for fever.  Respiratory:  Positive for cough.      Physical Exam Triage Vital Signs ED Triage Vitals  Encounter Vitals Group     BP 12/20/24 1744 113/79     Girls Systolic BP Percentile --      Girls Diastolic BP Percentile --      Boys Systolic BP Percentile --       Boys Diastolic BP Percentile --      Pulse Rate 12/20/24 1744 (!) 107     Resp 12/20/24 1744 16     Temp 12/20/24 1744 99.2 F (37.3 C)     Temp Source 12/20/24 1744 Oral     SpO2 12/20/24 1744 95 %     Weight --      Height --      Head Circumference --      Peak Flow --      Pain Score 12/20/24 1741 10     Pain Loc --      Pain Education --      Exclude from Growth Chart --    No data found.  Updated Vital Signs BP 113/79 (BP Location: Left Arm)   Pulse (!) 107   Temp 99.2 F (37.3 C) (Oral)   Resp 16   LMP 12/15/2024 (Approximate)   SpO2 95%   Visual Acuity Right Eye Distance:   Left Eye Distance:   Bilateral Distance:    Right Eye Near:   Left Eye Near:    Bilateral Near:     Physical Exam Vitals reviewed.  Constitutional:      General: She is not in acute distress.    Appearance: She is not ill-appearing, toxic-appearing or diaphoretic.  HENT:     Right Ear: Tympanic membrane and ear canal normal.     Left Ear: Tympanic membrane and ear canal normal.     Nose: Congestion present.     Mouth/Throat:     Mouth: Mucous membranes are moist.     Comments: There is copious clear exudate in the oropharynx with some mild erythema. Eyes:     Extraocular Movements: Extraocular movements intact.     Conjunctiva/sclera: Conjunctivae normal.     Pupils: Pupils are equal, round, and reactive to light.  Cardiovascular:     Rate and Rhythm: Normal rate and regular rhythm.     Heart sounds: No murmur heard. Pulmonary:     Effort: Pulmonary effort is normal. No respiratory distress.     Breath sounds: No stridor. No wheezing, rhonchi or rales.  Musculoskeletal:     Cervical back: Neck supple.  Lymphadenopathy:     Cervical: No cervical adenopathy.  Skin:    Capillary Refill: Capillary refill takes less than 2 seconds.     Coloration: Skin is not jaundiced or pale.  Neurological:     General: No focal deficit present.     Mental Status: She is alert and oriented  to person, place, and time.  Psychiatric:  Behavior: Behavior normal.      UC Treatments / Results  Labs (all labs ordered are listed, but only abnormal results are displayed) Labs Reviewed  POC SOFIA SARS ANTIGEN FIA  POCT INFLUENZA A/B    EKG   Radiology No results found.  Procedures Procedures (including critical care time)  Medications Ordered in UC Medications - No data to display  Initial Impression / Assessment and Plan / UC Course  I have reviewed the triage vital signs and the nursing notes.  Pertinent labs & imaging results that were available during my care of the patient were reviewed by me and considered in my medical decision making (see chart for details).     Flu test and COVID test are both negative  Since she is exposed to the flu, I am so going to treat her with Tamiflu  for influenza.  Tessalon Perles are sent in for the cough. Final Clinical Impressions(s) / UC Diagnoses   Final diagnoses:  Viral URI  Influenza-like illness     Discharge Instructions      The testing for flu and COVID was negative.  Still since you had close exposure to someone with the flu, I would like to treat you for influenza-like illness and exposure to the flu:  Take oseltamivir  75 mg--1 capsule 2 times daily for 5 days  Take benzonatate 100 mg, 1 tab every 8 hours as needed for cough.  Make sure you are drinking plenty of fluids     ED Prescriptions     Medication Sig Dispense Auth. Provider   benzonatate (TESSALON) 100 MG capsule Take 1 capsule (100 mg total) by mouth 3 (three) times daily as needed for cough. 21 capsule Vonna Sharlet POUR, MD   oseltamivir  (TAMIFLU ) 75 MG capsule Take 1 capsule (75 mg total) by mouth every 12 (twelve) hours. 10 capsule Cendy Oconnor K, MD      PDMP not reviewed this encounter.     [1]  Social History Tobacco Use   Smoking status: Never   Smokeless tobacco: Never  Vaping Use   Vaping status: Never  Used  Substance Use Topics   Alcohol use: No   Drug use: No     Vonna Sharlet POUR, MD 12/20/24 1832  "

## 2024-12-20 NOTE — ED Triage Notes (Signed)
 Pt reports fevers, body aches, nasal congestion, productive cough, and dyspnea with normal exertion x3 days. Max temp: 103 earlier today. Tylenol , motrin , dayquil, nyquil, and painquil with little relief. Her 105 month old godson tested positive for flu and was recently around him.

## 2024-12-20 NOTE — Discharge Instructions (Signed)
 The testing for flu and COVID was negative.  Still since you had close exposure to someone with the flu, I would like to treat you for influenza-like illness and exposure to the flu:  Take oseltamivir  75 mg--1 capsule 2 times daily for 5 days  Take benzonatate 100 mg, 1 tab every 8 hours as needed for cough.  Make sure you are drinking plenty of fluids
